# Patient Record
Sex: Male | Born: 1960
Health system: Western US, Academic
[De-identification: ages and names within clinical notes are randomized; demographics above are authoritative.]

---

## 2022-04-19 ENCOUNTER — Telehealth

## 2022-04-19 NOTE — Telephone Encounter
Call Back Request      Reason for call back: patient would  Like to schedule with you says he has extreme pain maybe fatty impigment.    Patient has a CD will try and upload but if not able can he schedule and bring in CD       Patient would like to know how much is out of pocket cost for office visit     Please call and advise   Thank You     Any Symptoms:  []  Yes  [x]  No       If yes, what symptoms are you experiencing:    o Duration of symptoms (how long):    o Have you taken medication for symptoms (OTC or Rx):      If call was taken outside of clinic hours:    [] Patient or caller has been notified that this message was sent outside of normal clinic hours.     [] Patient or caller has been warm transferred to the physician's answering service. If applicable, patient or caller informed to please call back if symptoms progress.  Patient or caller has been notified of the turnaround time of 1-2 business day(s).

## 2022-04-21 NOTE — Telephone Encounter
Patient was contacted but no answer. If patient calls back please inform them of the message below.

## 2022-05-16 NOTE — Telephone Encounter
Call Back Request      Reason for call back:   Patient would like to be seen for knee pain - patient was advised he has fatty impingement of right knee - patient was advised there may also be arthritis    Patient has MRI from 03/25/22    Patient has anthem that will be effective 05/22/22    Please assist, thank you     Any Symptoms:  []  Yes  [x]  No       If yes, what symptoms are you experiencing:    o Duration of symptoms (how long):    o Have you taken medication for symptoms (OTC or Rx):      If call was taken outside of clinic hours:    [] Patient or caller has been notified that this message was sent outside of normal clinic hours.     [] Patient or caller has been warm transferred to the physician's answering service. If applicable, patient or caller informed to please call us back if symptoms progress.  Patient or caller has been notified of the turnaround time of 1-2 business day(s).

## 2022-05-19 NOTE — Telephone Encounter
Called and left VM, patient should be scheduled with non op, no records on file

## 2022-05-22 DIAGNOSIS — I10 Essential (primary) hypertension: Secondary | ICD-10-CM

## 2022-05-22 DIAGNOSIS — F419 Anxiety disorder, unspecified: Secondary | ICD-10-CM

## 2022-05-23 ENCOUNTER — Telehealth: Payer: BLUE CROSS/BLUE SHIELD

## 2022-05-23 ENCOUNTER — Ambulatory Visit: Payer: BLUE CROSS/BLUE SHIELD

## 2022-05-23 DIAGNOSIS — Z7689 Persons encountering health services in other specified circumstances: Secondary | ICD-10-CM

## 2022-05-23 DIAGNOSIS — N529 Male erectile dysfunction, unspecified: Secondary | ICD-10-CM

## 2022-05-23 DIAGNOSIS — R202 Paresthesia of skin: Secondary | ICD-10-CM

## 2022-05-23 DIAGNOSIS — G8929 Other chronic pain: Secondary | ICD-10-CM

## 2022-05-23 DIAGNOSIS — H538 Other visual disturbances: Secondary | ICD-10-CM

## 2022-05-23 DIAGNOSIS — M25561 Pain in right knee: Secondary | ICD-10-CM

## 2022-05-23 NOTE — Patient Instructions
It was a pleasure seeing you today.    One or more specialist referrals were placed today. You can call [phone] for assistance in scheduling your appointment(s).   Dr. Wesley Blas in sports medicine    Please feel free to contact us should you have any additional questions or concerns.    Sincerely,    Cristie Hem, MD

## 2022-05-23 NOTE — Progress Notes
[facility] INTERNAL MEDICINE & PEDIATRICS  [facility]  [address]  [address]  [place] [address]  Phone: [phone]  FAX: [phone]    Subjective:     HPI:  Tyler Mahoney is a 61 y.o. male presenting for the following:    Chief Complaint   Patient presents with   ? Establish Care     Runs and surfs regularly, started experiencing R knee pain several months ago. Underwent MRI, had steroid injection and did PT. Now having trouble walking due to the knee. Has seen ortho as well. 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    1 month ago started experiencing tingling in the hands and feet, weight loss of about 4 lbs over several months, insomnia, anxiety, twitching in the eyes, and blurred vision, and heat sensitivity. Now unable to get or maintain erection. Experiencing significant illness anxiety surrounding these issues, particularly in light of his prior good health.    Saw psychiatry today after recent ED visit for severe anxiety. On ativan for now, planning to start daily medication and counseling.    Labs from 9/14 with glucose 116, AST 48, CK 256, hgb 13.5, a1c 5.7, RF 15 (ULN 14). Normal SPEP, UPEP, B12, folates, TSH, testosterone, ANA, CCP, inflammatory markers, hepatitis panel.    Has seen rheumatology, told no autoimmune disease.    Patient Active Problem List   Diagnosis   ? Adenomatous colon polyp   ? Anxiety   ? Erectile dysfunction   ? Essential hypertension   ? Hyperlipidemia       Medications and Allergies  Outpatient Medications Prior to Visit   Medication Sig   ? atorvastatin 40 mg tablet 1 tablet (40 mg total).   ? celecoxib (CELEBREX) 200 mg capsule 1 capsule (200 mg total).   ? eszopiclone (LUNESTA) 1 mg tablet 1 tablet (1 mg total).   ? LORazepam 0.5 mg tablet 1 tablet (0.5 mg total).   ? losartan 50 mg tablet 2 tablets (100 mg total). ? sildenafil 50 mg tablet 1 tablet (50 mg total).   ? traZODone 50 mg tablet 1 tablet (50 mg total).     No facility-administered medications prior to visit.       Not on File     Objective:     Physical Exam  BP 166/84  ~ Pulse 68  ~ Temp 36.8 ?C (98.3 ?F) (Tympanic)  ~ Resp 18  ~ Ht 5' 4'' (1.626 m)  ~ Wt 129 lb (58.5 kg)  ~ SpO2 97%  ~ BMI 22.14 kg/m?     General: alert, well appearing, no acute distress  Head: atraumatic, normocephalic  Eyes: sclerae anicteric, no conjunctival injection  Throat: clear oropharynx, no injection or exudate  Heart: normal rate, regular rhythm, normal S1, S2, no murmurs, rubs, or gallops  Lungs: clear to auscultation, no wheezes, rales, or rhonchi, normal work of breathing  Extremities: no edema  Skin: normal coloration, no rashes  Neuro: alert, oriented, CN II-XII intact, 5/5 strength throughout with exception of 4/5 with left knee flexion and extension, distal sensation grossly intact, no dysmetria, normal gait, negative Romberg   Psych: normal affect, insight, and judgment    Labs/Imaging  No results found for this or any previous visit.      Assessment:     1. Encounter to establish care    2. Anxiety  3. Paresthesia  4. Blurred vision  Unclear etiology of these neurologic symptoms, as he has undergone extensive workup involving laboratory evaluation that was reviewed at today's visit, reported brain MRI, and evaluation through outside rheumatologist that has been largely unremarkable.  Consider that they could be related to acute onset of recent severe anxiety.  - Referral to Neurology  - continue follow-up with Psychiatry    5. Erectile dysfunction, unspecified erectile dysfunction type  Reportedly developed after he stops taking several over-the-counter supplements.  Has upcoming appointment scheduled with Urology.    6. Chronic pain of right knee  Addressed in brief today, as he has had evaluation through multiple outside orthopedic specialists by his report. 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  Has completed full courses of PT along with steroid injection without improvement.  He is interested in additional opinion.  - Referral to Primary Care, Sports Medicine    7. Essential hypertension  Uncontrolled by today's measure, suspect this may be related at least in part to uncontrolled anxiety.  - defer home blood pressure checks for now given concern this may exacerbate anxiety  - follow-up in 2-4 weeks    The above plan of care, diagnosis, orders, and follow-up were discussed with the patient.  Questions related to this recommended plan of care were answered.    Cristie Hem, MD  Internal Medicine and Pediatrics Primary Care

## 2022-05-23 NOTE — Telephone Encounter
Message to Practice/Provider      Message: Pt was just seen by MD. Pt says that next available for neurology appointment was February. Pt says they could get him in sooner if referral was placed as urgent referral. Please advise    Return call is not being requested by the patient or caller.    Patient or caller has been notified of the turnaround time of 1-2 business day(s).

## 2022-05-24 NOTE — Addendum Note
Addended by: Krystal Eaton on: 05/24/2022 10:16 AM     Modules accepted: Orders

## 2022-05-25 ENCOUNTER — Ambulatory Visit: Payer: BLUE CROSS/BLUE SHIELD

## 2022-05-27 ENCOUNTER — Institutional Professional Consult (permissible substitution): Payer: BLUE CROSS/BLUE SHIELD

## 2022-05-27 ENCOUNTER — Ambulatory Visit: Payer: BLUE CROSS/BLUE SHIELD | Attending: Student in an Organized Health Care Education/Training Program

## 2022-05-27 ENCOUNTER — Ambulatory Visit: Payer: BLUE CROSS/BLUE SHIELD

## 2022-05-27 DIAGNOSIS — R200 Anesthesia of skin: Secondary | ICD-10-CM

## 2022-05-27 DIAGNOSIS — N529 Male erectile dysfunction, unspecified: Secondary | ICD-10-CM

## 2022-05-27 DIAGNOSIS — I10 Essential (primary) hypertension: Secondary | ICD-10-CM

## 2022-05-27 MED ORDER — GABAPENTIN 300 MG PO CAPS
300 mg | ORAL_CAPSULE | Freq: Every evening | ORAL | 0 refills | Status: SS | PRN
Start: 2022-05-27 — End: 2022-06-09

## 2022-05-27 NOTE — Progress Notes
OUTPATIENT PROGRESS NOTE      PATIENT: Tyler Mahoney  MRN: [id number]  DOB: Jun 04, 1961  DATE OF SERVICE: 05/27/2022  CHIEF COMPLAINT:   Chief Complaint   Patient presents with   ? Insomnia     Per patient has experienced a list of different symptoms over the last month: Overeating, sensitive to sun, high bp     HISTORY OF PRESENT ILLNESS   Tyler Mahoney is a 61 y.o. male w/ h/o below who is here for concerns about Insomnia (Per patient has experienced a list of different symptoms over the last month: Overeating, sensitive to sun, high bp).    Patient reports past 2-3 weeks his anxiety has significantly worsened.    State in the past he was always a happy individual but he had an injury of his knee and since has not been able to surf and may be why he has worsening anxiety.    State he is followed by Dr. Alvino Chapel, psychiatrist. Tyler Mahoney earlier this week and was switched from duloxetine to wellbutrin due to feeling muted on the duloxetine. Patient was also trialed on seroquel, trazodone, and ativan. State nothing seem to help with his anxiety and insomnia.    State feels like he is always on fight or flight feeling. And is anxious about his anxiety. Patient also report heat sensitivity w/ pruritus but w/o rash. As well as impotence, and numbness.    Patient reports worried he may have MS. However, state he had an MRI brain done in [facility] that he paid out of pocket that was normal and he also had a neurology friend look at it. State he has an appointment scheduled with an outside neurologist next month.    States he even self increased his blood pressure medication to see if it would help with his blood pressure but state it doesn't.    States he feels so off.    W/ passive SI, no plan    PAST MEDICAL AND SURGICAL HISTORY:   Past Medical History:   Diagnosis Date   ? Anxiety 05/04/2022   ? Depression 05/04/2022   ? Hyperlipidemia 03/20/2012    Thats an estimate   ? Hypertension 03/20/2012    Estimate   ? Memory problem 05/04/2032 Past Surgical History:   Procedure Laterality Date   ? VASECTOMY  08/26/96    Estimate       Family History   Problem Relation Age of Onset   ? Depression Mother    ? Hypertension Father    ? Depression Brother      SOC HISTORY:  Social History     Socioeconomic History   ? Marital status: Unknown   Tobacco Use   ? Smoking status: Never   ? Smokeless tobacco: Never   Substance and Sexual Activity   ? Alcohol use: Yes     Alcohol/week: 6.0 oz     Types: 4 Drinks Containing 1.5 oz of alcohol per week   ? Drug use: Yes     Frequency: 1.0 times per week     Types: Marijuana   ? Sexual activity: Yes     Partners: Female     Birth control/protection: Vasectomy   Other Topics Concern   ? Do you exercise at least a day, 3 or more days a week? Yes     Comment: But not in last 5 month   ? Types of Exercise? (List in Comments) Yes     Comment: Running  surfing   ? Do you follow a special diet? No   ? Vegan? No   ? Vegetarian? No   ? Pescatarian? No   ? Lactose Free? No   ? Gluten Free? No   ? Omnivore? No     MEDICATIONS:  Outpatient Medications Prior to Visit   Medication Sig   ? atorvastatin 40 mg tablet 1 tablet (40 mg total).   ? buPROPion, XL, 150 mg 24 hr tablet Take 1 tablet (150 mg total) by mouth.   ? celecoxib (CELEBREX) 200 mg capsule 1 capsule (200 mg total).   ? LORazepam 0.5 mg tablet 1 tablet (0.5 mg total).   ? losartan 50 mg tablet 2 tablets (100 mg total).   ? traZODone 50 mg tablet 1 tablet (50 mg total).   ? QUEtiapine 25 mg tablet    ? eszopiclone (LUNESTA) 1 mg tablet 1 tablet (1 mg total). (Patient not taking: Reported on 05/27/2022.)     No facility-administered medications prior to visit.     ALLERGIES:   No Known Allergies  IMMUNIZATIONS:  Immunization History   Administered Date(s) Administered   ? COVID-19, mRNA, (Moderna) 100 mcg/0.5 mL 10/23/2019, 11/20/2019, 08/03/2020   ? Influenza Vaccine, Pediatric, Trivalent, With Preservative 06/06/2013   ? Tdap 04/03/2013      REVIEW OF SYSTEMS:  14 system review performed; all systems negative except as documented above in HPI    PHYSICAL EXAM   BP (!) 185/84  ~ Pulse 68  ~ Temp 36.4 ?C (97.5 ?F) (Forehead)  ~ Ht 5' 4'' (1.626 m)  ~ Wt 130 lb 3.2 oz (59.1 kg)  ~ BMI 22.35 kg/m?   Wt Readings from Last 3 Encounters:   05/27/22 130 lb 3.2 oz (59.1 kg)   05/23/22 129 lb (58.5 kg)      BP Readings from Last 3 Encounters:   05/27/22 (!) 185/84   05/23/22 166/84      Body mass index is 22.35 kg/m?Marland Kitchen    General appearance: alert, appears stated age, NAD  Head:  normocephalic, atraumatic, without obvious abnormality  Eyes:  lids and conjunctiva grossly normal. Clear sclera  ENMT: external ears without abnormality  Neck: midline trachea  Cardiovascular;  RR.  Respiratory: no increase WOB  Skin: skin color grossly normal, no rashes or lesions on inspection or palpation  Musculoskeletal exam: inspection without abnormality, muscle tone grossly normal, extremities WWP, no LE edema  Neurological exam:  Strength and sensation grossly intact, normal gait  Psychiatric:  A&O, rapid speech, tangential, ruminating about his anxiety and symptoms    LABS/STUDIES   I have:   [x]  Reviewed/ordered []  1 [x]  2 []  ? 3 unique laboratory, radiology, and/or diagnostic tests noted below    [x]  Reviewed []  1 [x]  2 []  ? 3 prior external notes and incorporated into patient assessment    []  Discussed management or test interpretation with external provider(s) as noted       LABS:  No pertinent labs    Imaging Studies:   No results found.         Health Maintenance   Topic Date Due   ? Routine Hepatitis B Screening (recommended at least once for patients 18 and older)  Never done   ? Routine Hepatitis C Screening (recommended at least once for patients 54-48 years old)  Never done   ? Routine HIV Screening (recommended at least once for patients 92-31 years old)  Never done   ? Colorectal Cancer Screening  Never done   ? Shingles Vaccine (Shingrix)  (1 of 2) Never done   ? Annual Preventive Wellness Visit  Never done   ? COVID-19 Vaccine (4 - Moderna series) 09/28/2020   ? Flu Vaccine (1) 04/22/2022   ? Td (Tetanus) / Tdap (Tetanus, Pertussis-Whooping Cough) Vaccine (2 - Td or Tdap) 04/04/2023        A&P   Yovani Cogburn is a a 61 y.o. male presenting for:    With new onset anxiety and depression. F/b outside psychiatrist Dr. Alvino Chapel. Was on Duloxetine but felt muted with it so was switched to wellbutrin (since his brother is on it and doing well) this week. Patient sx consists of numbness, insomnia, heat sensitivity of his back, overeating, erectile dysfunction, pruritus. Patient report has had MRI brain unremarkable. Has Neurology appt scheduled in November for further work-up. Currently on seroquel, trazodone, and ativan w/o relief of insomnia. Does not have f/u appointment with his Psychiatrist. W/ passive SI though no plan. Found today with significantly elevated blood pressure. To which patient report has increased his losartan dose to 100mg  without improvement of his blood pressure. Query his sx are from his anxiety vs from organic causes. Pt currently not taking seroquel since it made Mahoney feel sick.  - BMP and UPC ordered to evaluate kidney function  - cont wellbutrin rx by psychiatrist.  - Trial gabapentin 300mg  at bedtime over the weekend for his anxiety and insomnia. Do not recommend taking gabapentin with the ativan, seroquel, and trazodone.  - If gabapentin doesn't, work, pt can try one of the other ones that Dr. Alvino Chapel prescribed. However, do not recommend taking all at the same time given risk for CNS depression.  - recommend pt follow-up with his psychiatrist Dr. Alvino Chapel  - recommend pt follow-up with his PCP Dr. Jordan Likes for blood pressure though query if bp would improve with mgmt of anxiety  - pt has appointment with Neurologist per pt.  - Provided ED/[facility]/return precaution.   - provided SI hotline    Patient Instructions   Please follow-up with Dr. Alvino Chapel your psychiatrist.  Please follow-up with Dr. Jordan Likes.  Suicide Prevention Hotlines:    ?         National Suicide Prevention Lifeline: 229-121-6514    ?         National Suicide Prevention Lifeline (Spanish): 931-361-1145    ?         For Deaf and Hard of Hearing contact Lifeline via TTY: 662 774 3537    ?         Suicide Prevention Hotline [place]: 224-304-9522    ?         Teen Line: (813)600-1050    ?         [employer] (Bilingual): 540-872-4720    ?         [employer]: 323 224 9110    ?         The [employer] Lifeline: (539)473-8528    Suicide Prevention Text lines:    ?         Crisis text line: 905-040-8563    ?         TrevorText: 660630      Anxiety Reaction  Anxiety is the feeling we all get when we think something bad might happen. It is a normal response to stress. It most often causes only a mild reaction. But it can interfere with daily life when anxiety is more severe. In some  cases, you may not know what you?re anxious about. Anxiety seems to have both mental and physical triggers. You may have stress from home and family. Or work and social relationships. Anxiety tends to run in families. This may mean it?s linked to genes.   During an anxiety reaction, you may feel:   ? Helpless  ? Nervous  ? Depressed  ? Grouchy  Your body may show signs of anxiety in many ways. You may have:   ? Dry mouth  ? Shakiness  ? Dizziness  ? Weakness  ? Trouble breathing  ? Fast breathing  ? Chest pressure  ? Sweating  ? Headache  ? Nausea  ? Diarrhea  ? Tiredness  ? Inability to sleep  ? Sexual problems  Home care  Try to find those things that set off anxiety in your life. They may not be obvious. They may include:   ? Daily hassles of life. This can include traffic jams, missed appointments, or car troubles.  ? Major life changes. This means both good changes, such as a new baby or job promotion. This can also mean tough life changes, such as loss of a job or loss of a loved one.  ? Overload. This means feeling that you have too many responsibilities. And that you can't take care of all of them.  ? Feeling helpless. You may feel you don?t have any control or choices. You may feel that your problems can't be solved.  Notice how your body reacts to stress. This will help you take action before the stress sets off anxiety. When you can, make changes to reduce the sources of your stress. But stress in life often can't be prevented. It is important to learn how to manage stress to reduce anxiety. There are many proven methods that will reduce your anxiety. These include:   ? Exercise  ? Good nutrition  ? Getting enough sleep  ? Relaxation methods  ? Breathing exercises  ? Visualization  ? Biofeedback  ? Meditation  ? Counseling  ? Medicine  For more information about this, talk with your healthcare provider. Or check online or at your [employer] or bookstore. You'll find many books and audiobooks on this subject.   Follow-up care  If you feel your anxiety is not getting better with self-help, call your healthcare provider. Or make an appointment with a counselor. You may need short-term counseling or medicine to help you manage anxiety.   Call 911  Call 911 if any of the following occur:   ? Trouble breathing  ? Confusion  ? Drowsiness or trouble waking up  ? Fainting  ? Rapid heart rate  ? Seizure  ? New chest pain that becomes more severe, lasts longer, or spreads into your shoulder, arm, neck, jaw, or back  Call or text [phone] if you have thoughts of harming yourself or others. You will be connected to trained crisis counselors at the [employer]. An online chat option is also available at http://hill.com/. You can also call Lifeline at 800-273-TALK (816)588-2526). Lifeline is free and available 24/7.   When to get medical advice  Call your healthcare provider right away if any of the following occur:   ? Symptoms that don't improve or get worse, such as feelings of hopelessness or overwhelming sadness  ? Severe headache not eased by rest and mild pain medicine  The National Suicide Prevention Lifeline is available at 800-273-TALK 651-833-3883). The Lifeline is available 24/7 and  provides free and confidential support. The Lifeline also has an Mudlogger at http://hill.com/.   StayWell last reviewed this educational content on 02/19/2021  ? 2000-2023 [employer], [employer]. All rights reserved. This information is not intended as a substitute for professional medical care. Always follow your healthcare professional's instructions.          Return if symptoms worsen or fail to improve.   Future Appointments   Date Time Provider Department Center   06/14/2022  4:30 PM Gillis Ends, MD PEDS [facility]   06/20/2022  8:00 AM Lacretia Nicks, DO NEUGENCAL [place]/       The above recommendation were discussed with the patient.  The patient has all questions answered satisfactorily and is in agreement with this recommended plan of care.    50 minutes were spent personally by me today on this encounter which include today's pre-visit review of the chart, obtaining appropriate history, performing an evaluation, documentation and discussion of management with details supported within the note for today's visit. The time documented was exclusive of any time spent on the separately billed procedure.    Armanda Magic, MD, MPH  Family Medicine  Health Science Clinical Instructor  Department of Medicine, [facility]  [address]  [place], [place] [address]  05/27/2022 5:35 PM

## 2022-05-27 NOTE — Patient Instructions
Please follow-up with Dr. Alvino Chapel your psychiatrist.  Please follow-up with Dr. Jordan Likes.  Suicide Prevention Hotlines:    ?         National Suicide Prevention Lifeline: 434-596-6084    ?         National Suicide Prevention Lifeline (Spanish): 430-760-8733    ?         For Deaf and Hard of Hearing contact Lifeline via TTY: 519-529-0587    ?         Suicide Prevention Hotline [place]: 438-562-4938    ?         Teen Line: 410-071-3106    ?         [employer] (Bilingual): (713) 134-6201    ?         [employer]: 615-180-1000    ?         The [employer] Lifeline: 401 551 2146    Suicide Prevention Text lines:    ?         Crisis text line: (832) 534-7802    ?         TrevorText: 706237      Anxiety Reaction  Anxiety is the feeling we all get when we think something bad might happen. It is a normal response to stress. It most often causes only a mild reaction. But it can interfere with daily life when anxiety is more severe. In some cases, you may not know what you?re anxious about. Anxiety seems to have both mental and physical triggers. You may have stress from home and family. Or work and social relationships. Anxiety tends to run in families. This may mean it?s linked to genes.   During an anxiety reaction, you may feel:   Helpless  Nervous  Depressed  Grouchy  Your body may show signs of anxiety in many ways. You may have:   Dry mouth  Shakiness  Dizziness  Weakness  Trouble breathing  Fast breathing  Chest pressure  Sweating  Headache  Nausea  Diarrhea  Tiredness  Inability to sleep  Sexual problems  Home care  Try to find those things that set off anxiety in your life. They may not be obvious. They may include:   Daily hassles of life. This can include traffic jams, missed appointments, or car troubles.  Major life changes. This means both good changes, such as a new baby or job promotion. This can also mean tough life changes, such as loss of a job or loss of a loved one.  Overload. This means feeling that you have too many responsibilities. And that you can't take care of all of them.  Feeling helpless. You may feel you don?t have any control or choices. You may feel that your problems can't be solved.  Notice how your body reacts to stress. This will help you take action before the stress sets off anxiety. When you can, make changes to reduce the sources of your stress. But stress in life often can't be prevented. It is important to learn how to manage stress to reduce anxiety. There are many proven methods that will reduce your anxiety. These include:   Exercise  Good nutrition  Getting enough sleep  Relaxation methods  Breathing exercises  Visualization  Biofeedback  Meditation  Counseling  Medicine  For more information about this, talk with your healthcare provider. Or check online or at your [employer] or bookstore. You'll find many books and audiobooks on this subject.  Follow-up care  If you feel your anxiety is not getting better with self-help, call your healthcare provider. Or make an appointment with a counselor. You may need short-term counseling or medicine to help you manage anxiety.   Call 911  Call 911 if any of the following occur:   Trouble breathing  Confusion  Drowsiness or trouble waking up  Fainting  Rapid heart rate  Seizure  New chest pain that becomes more severe, lasts longer, or spreads into your shoulder, arm, neck, jaw, or back  Call or text [phone] if you have thoughts of harming yourself or others. You will be connected to trained crisis counselors at the [employer]. An online chat option is also available at [email or web address] You can also call Lifeline at 800-273-TALK 819-039-7712). Lifeline is free and available 24/7.   When to get medical advice  Call your healthcare provider right away if any of the following occur:   Symptoms that don't improve or get worse, such as feelings of hopelessness or overwhelming sadness  Severe headache not eased by rest and mild pain medicine  The National Suicide Prevention Lifeline is available at 800-273-TALK 831-001-8258). The Lifeline is available 24/7 and provides free and confidential support. The Lifeline also has an Mudlogger at [email or web address]   StayWell last reviewed this educational content on 02/19/2021  ? 2000-2023 [employer], [place]. All rights reserved. This information is not intended as a substitute for professional medical care. Always follow your healthcare professional's instructions.

## 2022-05-28 LAB — Basic Metabolic Panel: CALCIUM: 9.6 mg/dL (ref 8.6–10.4)

## 2022-05-28 LAB — PROTEIN/CREATININE RATIO, URINE: PROTEIN,URINE: 8 mg/dL (ref 0.0–0.4)

## 2022-06-04 ENCOUNTER — Ambulatory Visit: Payer: BLUE CROSS/BLUE SHIELD

## 2022-06-04 NOTE — ED Notes
[facility]?NOTIFICATION?06/04/2022 13:41?[place], [place]?MRN: [id number]    Criteria Met      CURES    Security and Safety  No Security Events were found.  ED Care Guidelines  There are currently no ED Care Guidelines for this patient. Please check your facility's medical records system.          Prescription Drug Report (12 Mo.)  Rx Details  Fill Date Drug Description Qty. Prescriber   2022-05-30 CLONAZEPAM/0.5 MG/TAB [address] [place]   2022-05-20 LORAZEPAM/0.5 MG/TAB 3 [facility]   2022-05-20 ESZOPICLONE/1 MG/TAB 4 PASSARO, Lollie Marrow, RYAN     Rx Summary  Metric Count   Quantity Dispensed 21   Unique Prescribers 2   Unique Pharmacies 1       E.D. Visit Count (12 mo.)  Facility Visits   [facility] 1   Total 1   Note: Visits indicate total known visits.     Recent Emergency Department Visit Summary  Date [place] Type Diagnoses or Chief Complaint    Jun 04, 2022  [place]  Emergency       Recent Inpatient Visit Summary  No Recent Inpatient Visits were found.  Care Team  No Care Team was found.  [facility]  This patient has registered at the [facility] Emergency Department   For more information visit: [email or web address]   PLEASE NOTE:     1.   Any care recommendations and other clinical information are provided as guidelines or for historical purposes only, and providers should exercise their own clinical judgment when providing care.    2.   You may only use this information for purposes of treatment, payment or health care operations activities, and subject to the limitations of applicable [facility] Policies.    3.   You should consult directly with the organization that provided a care guideline or other clinical history with any questions about additional information or accuracy or completeness of information provided.    ? 5176 [facility] - www.[email or web address]

## 2022-06-05 ENCOUNTER — Inpatient Hospital Stay
Admit: 2022-06-05 | Discharge: 2022-06-05 | Disposition: A | Discharge status: Other Acute Inpatient Hospital | Payer: BLUE CROSS/BLUE SHIELD | Source: Home / Self Care

## 2022-06-05 DIAGNOSIS — R45851 Suicidal ideations: Secondary | ICD-10-CM

## 2022-06-05 DIAGNOSIS — R488 Other symbolic dysfunctions: Secondary | ICD-10-CM

## 2022-06-05 LAB — Bilirubin,Total: BILIRUBIN,TOTAL: 0.2 mg/dL (ref 0.1–1.2)

## 2022-06-05 LAB — HS Troponin I (Reflexed): HIGH SENSITIVITY TROPONIN I: 7 ng/L — ABNORMAL HIGH (ref <5)

## 2022-06-05 LAB — Electrolyte Panel: CHLORIDE: 107 mmol/L — ABNORMAL HIGH (ref 96–106)

## 2022-06-05 LAB — Calcium: CALCIUM: 8.9 mg/dL (ref 8.6–10.4)

## 2022-06-05 LAB — Magnesium: MAGNESIUM: 1.7 meq/L (ref 1.4–1.9)

## 2022-06-05 LAB — Expedited COVID-19 and Influenza A B PCR: COVID-19 PCR/TMA: NOT DETECTED

## 2022-06-05 LAB — COVID-19 PCR: COVID-19 PCR/TMA: NOT DETECTED

## 2022-06-05 LAB — Alkaline Phosphatase: ALKALINE PHOSPHATASE: 68 U/L (ref 37–113)

## 2022-06-05 LAB — CREATININE: ESTIMATED GFR 2021 CKD-EPI: 78 mL/min/{1.73_m2} (ref 0.60–1.30)

## 2022-06-05 LAB — TSH with reflex FT4, FT3: TSH: 1.2 u[IU]/mL (ref 0.3–4.7)

## 2022-06-05 LAB — UA,Microscopic: SQUAMOUS EPITHELIAL CELLS: 0 {cells}/uL (ref 0–17)

## 2022-06-05 LAB — UA,Dipstick: SPECIFIC GRAVITY: 1.018 (ref 1.005–1.030)

## 2022-06-05 LAB — CBC: RED CELL DISTRIBUTION WIDTH-SD: 42.9 fL (ref 36.9–48.3)

## 2022-06-05 LAB — Aspartate Aminotransferase: ASPARTATE AMINOTRANSFERASE: 49 U/L (ref 13–62)

## 2022-06-05 LAB — HS Troponin I + Reflex If >=  5 ng/L: HIGH SENSITIVITY TROPONIN I: 6 ng/L — ABNORMAL HIGH (ref <5)

## 2022-06-05 LAB — Urea Nitrogen: UREA NITROGEN: 17 mg/dL (ref 7–22)

## 2022-06-05 LAB — Phosphorus: PHOSPHORUS: 3.9 mg/dL (ref 2.3–4.4)

## 2022-06-05 LAB — Glucose, Whole Blood: GLUCOSE, WHOLE BLOOD: 144 mg/dL — ABNORMAL HIGH (ref 65–99)

## 2022-06-05 LAB — Alanine Aminotransferase: ALANINE AMINOTRANSFERASE: 59 U/L (ref 8–70)

## 2022-06-05 MED ADMIN — OLANZAPINE 2.5 MG PO TBDP: 2.5 mg | ORAL | @ 09:00:00 | Stop: 2022-06-05 | NDC 33342008307

## 2022-06-05 MED ADMIN — CLONAZEPAM 0.25 MG PO TBDP: .5 mg | ORAL | @ 07:00:00 | Stop: 2022-06-05 | NDC 49884030752

## 2022-06-05 NOTE — Consults
Psychiatric ED Consultation Note 06/04/2022   Patient Name: Tyler Mahoney   Patient MRN: [id number]   Date of Birth: June 12, 1961   Patient Location: RR03C/RR03C     Patient Assessment:  This visit was conducted in-person with the patient.    Requesting Physician:  Darrick Penna., MD    Identifying Data:  Tyler Mahoney is a 61 y.o. male with no formal psychiatric history (possible undiagnosed depression in his teens in the setting of mother passing from lung cancer, no hospitalizations, no suicide attempts) who presents due to worsening ruminative anxiety, multiple somatic symptoms, chronic insomnia leading to passive SI and hopelessness; consult for DTS.     Chief Complaint: ''I am convinced I have MS''    Collateral Contact Information:  No emergency contact information on file.  Outpatient Psychiatrist: Birdie Riddle, MD  Outpatient Therapist: Has seen one but not consistently; trying to find a good fit    History of Present Illness:  Tyler Mahoney is a 61 y.o. male with no formal psychiatric history (possible undiagnosed depression in his teens in the setting of mother passing from lung cancer, no hospitalizations, no suicide attempts) who presents due to worsening ruminative anxiety, multiple somatic symptoms, chronic insomnia leading to passive SI and hopelessness; consult for DTS.    Interview conducted with patient and brother at bedside. Patient describes that he injured his knee a few months ago and since has had a bunch of medical ailments.  Patient describes he is noticed he is more sensitive to the sun, has had worsening blood pressure, has experienced weight loss despite overeating, has had more frequent urination, has had a unilateral cold foot, has had eye twitching, has had constipation, has had some muscular weakness and atrophy -- all leading to thinking that he has MS.  He describes that he is undergone multiple extensive workups including an outpatient MRI & a spinal tap while being admitted to an outside hospital.  Results have been reassuring that he does not have MS or another neurological condition.  He describes that he started seeing a psychiatrist, Dr. Birdie Riddle, who has prescribed multiple medications.  Per chart he has trialed Cymbalta, Wellbutrin, Seroquel, trazodone, Ativan, Lunesta, gabapentin, Klonopin.  He reports that in the last few weeks he is not been able to sleep more than 2-3 hours of sleep at night.  Despite not getting sleep he remains active during the day.  Brother at bedside describes that he has had to come down to [place] from [place] twice because of ruminative anxiety.  Brother describes that patient paces swears that himself is ruminative that he has done something to himself.  Patient states that he believes he is either developing MS or that erectile dysfunction medication he got from a gas station and took over the last decade have been causing these neurological symptoms.  Overall patient seems to endorse extreme feelings of guilt and hopelessness, increased appetite, insomnia, psychomotor agitation, and passive suicidal ideation.  He reports ?I can not go on like this.''When asked about a plan, he states ''I do not have 1 right now.  I do not have a gun.  I suppose I could jump a high surface or I suppose I could maybe overdose on medications I have.? Reports he is scared to go home. Patient denies AH/VH/HI.  Patient denies substance use.    Psychiatric History:  - Psychiatric diagnoses: No formal psychiatric history  - Psychiatric hospitalizations: No prior hospitalizations.  - Suicide attempts: No history of  suicide attempts.  - Self-injurious behavior: No history of self-injurious behavior.  - Violent behavior: No history of violent behavior.  - Engagement in outpatient psychiatric care: Seeing Birdie Riddle, MD   - Psychiatric medication trials: In the past few weeks, has trialed cymbalta, wellbutin for mood as well as lunesta, ativan, klonopin, seroquel, trazodone, and gabapentin for sleep    Substance Abuse History:  Denies    Alcohol Screen:  No or Low Risk: The patient was screened with a validated tool, and the score on the alcohol screen indicates no or low risk of alcohol related problems.    Tobacco Screen:  Never tobacco user.    Social History:  Lives with wife. Has two children in their late 16s, early 30s. 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 Likes to [employer] and run, generally is very physically active.     Family Psychiatric History:  Mother with possible Bipolar Disorder, EtOH use disorder (multiple hospitalizations in patient's youth leading to father getting custody)   Brother OCD, EtOH use disorder     Developmental and Educational History:  Not assessed due to patient's adult age and current clinical presentation.    Assets and Strengths:   Support of family/friends/partner (family)  Recreational interests (physical activity)  Good physical health (.)  Motivation for treatment (help-seeking)    Disabilities and Liabilities:  Poor understanding of illness (limited insight)    Past Medical History:  HTN   HLD    Allergies and Adverse Drug Reactions:  No Known Allergies    Medication Reconciliation:  WBN XL 150 mg   Klonopin 0.5 mg prn   Atorvastatin 40 mg   Losartan 100 mg     Review of Systems (of note, patient reports/endorses multiple somatic symptoms as detailed above and in ED note)  Constitutional: No fevers, chills, night sweats, + weight loss, malaise, or fatigue.   HEENT: No visual or auditory changes. No headache. No upper respiratory symptoms including cough, sore throat, runny nose, or sinus congestion.   Neck: No stiff neck, neck swelling.   Cardiovascular: No chest pain, palpitations, or shortness of breath.   Pulmonary: No orthopnea or paroxysmal nocturnal dyspnea.   Gastrointestinal: No nausea, vomiting, diarrhea, + constipation. No hematemesis, hematochezia, or melena.   Genitourinary: No dysuria, hematuria, + frequency, or urgency. Integumentary: No edema. No rash.   Neurologic: No focal weakness, numbness, paresthesia, or gait abnormality.    Physical Examination:  Vitals:    06/04/22 1556 06/04/22 1818 06/04/22 1936 06/04/22 1939   BP: 172/111 171/89 176/95 162/91   Pulse: 69 71 59 60   Resp: 18  18    Temp: 37.2 ?C (99 ?F)  36.7 ?C (98.1 ?F)    TempSrc: Temporal Temporal Temporal    SpO2: 100%  99%    Weight:       Height:            General: Appears to be in no acute distress.  HEENT: NC/AT, MMM, oropharynx clear.  CV: RRR, normal S1/S2, no M/R/G.  Pulm: CTAB, no wheezes/rhonchi/rales.  Abd: Soft, NT/ND, normal bowel sounds, no rebound or guarding.  Ext: No C/C/E, 2+ posterior tibial artery pulses bilaterally.  Neuro: 5/5 motor strength and sensation to light touch intact throughout bilateral upper and lower extremities, normal gait.     Cranial Nerve Examination:  CN II, III, IV, VI: PERRL, EOMI, no nystagmus, no ptosis on penlight exam.  CN V: Facial sensation intact to light touch, muscles of  mastication intact on direct observation.  CN VII: Face symmetric with good forehead wrinkle and smile excursion bilaterally, no facial droop on direct observation.  CN VIII: Hearing to finger rub is intact bilaterally.  CN IX, X: Uvula is midline, palate raises symmetrically, no hypophonia on pharyngeal exam.   CN XI: Shoulder shrug intact and equal bilaterally on shoulder shrug test.   CN XII: Tongue midline without fasciculation on tongue protrusion test.     Laboratory Data and Studies:  Recent Results (from the past 72 hour(s))   ED INFORMATION EXCHANGE    Collection Time: 06/04/22  1:42 PM   Result Value Ref Range    Emer. Dept. Info Exchange - Care Plan      Emer. Dept. Engineer, mining. Dept. Info Exchange - 30 day Visit Count 1     Emer. Dept. Info Exchange - 180 day Visit Count 1    Phosphorus    Collection Time: 06/04/22  6:48 PM   Result Value Ref Range    Phosphorus 3.9 2.3 - 4.4 mg/dL   Calcium    Collection Time: 06/04/22  6:48 PM   Result Value Ref Range    Calcium 8.9 8.6 - 10.4 mg/dL   Magnesium    Collection Time: 06/04/22  6:48 PM   Result Value Ref Range    Magnesium 1.7 1.4 - 1.9 mEq/L   CBC without differential    Collection Time: 06/04/22  6:48 PM   Result Value Ref Range    White Blood Cell Count 6.22 4.16 - 9.95 x10E3/uL    Red Blood Cell Count 4.54 4.41 - 5.95 x10E6/uL    Hemoglobin 13.9 13.5 - 17.1 g/dL    Hematocrit 45.4 09.8 - 52.0 %    Mean Corpuscular Volume 90.5 79.3 - 98.6 fL    Mean Corpuscular Hemoglobin 30.6 26.4 - 33.4 pg    MCH Concentration 33.8 31.5 - 35.5 g/dL    Red Cell Distribution Width-SD 42.9 36.9 - 48.3 fL    Red Cell Distribution Width-CV 13.0 11.1 - 15.5 %    Platelet Count, Auto 264 143 - 398 x10E3/uL    Mean Platelet Volume 9.4 9.3 - 13.0 fL    Nucleated RBC%, automated 0.0 No Ref. Range %    Absolute Nucleated RBC Count 0.00 0.00 - 0.00 x10E3/uL   Electrolyte Panel (Na, K, Cl, CO2)    Collection Time: 06/04/22  6:48 PM   Result Value Ref Range    Sodium 137 135 - 146 mmol/L    Potassium 3.8 3.6 - 5.3 mmol/L    Chloride 107 (H) 96 - 106 mmol/L    Total CO2 19 (L) 20 - 30 mmol/L    Anion Gap 11 8 - 19 mmol/L   Urea Nitrogen    Collection Time: 06/04/22  6:48 PM   Result Value Ref Range    Urea Nitrogen 17 7 - 22 mg/dL   Creatinine    Collection Time: 06/04/22  6:48 PM   Result Value Ref Range    Creatinine 1.08 0.60 - 1.30 mg/dL    Estimated GFR 78 See GFR Additional Information mL/min/1.83m2    GFR Additional Information See Comment    Glucose    Collection Time: 06/04/22  6:48 PM   Result Value Ref Range    Glucose 144 (H) 65 - 99 mg/dL   AST (SGOT)    Collection Time: 06/04/22  6:48 PM   Result Value  Ref Range    Aspartate Aminotransferase 49 13 - 62 U/L   ALT (SGPT)    Collection Time: 06/04/22  6:48 PM   Result Value Ref Range    Alanine Aminotransferase 59 8 - 70 U/L   Bilirubin,Total    Collection Time: 06/04/22  6:48 PM   Result Value Ref Range    Bilirubin,Total 0.2 0.1 - 1.2 mg/dL   Alkaline Phosphatase    Collection Time: 06/04/22  6:48 PM   Result Value Ref Range    Alkaline Phosphatase 68 37 - 113 U/L   HS Troponin + 2h reflex IF >= 5 ng/L    Collection Time: 06/04/22  6:48 PM   Result Value Ref Range    High Sensitivity Troponin I 6 (H) <5 ng/L   TSH with reflex FT4, FT3    Collection Time: 06/04/22  6:48 PM   Result Value Ref Range    TSH 1.2 0.3 - 4.7 mcIU/mL   ECG, 12 lead    Collection Time: 06/04/22  7:40 PM   Result Value Ref Range    Ventricular Rate 53 BPM    Atrial Rate 53 BPM    P-R Interval 173 ms    QRS Duration 105 ms    Q-T Interval 454 ms    QTC Calculation (Bezet) 427 ms    P Axis 47 degrees    R Axis 76 degrees    T Axis 55 degrees    Diagnosis Sinus rhythm     Diagnosis 12 Lead; Mason-Likar     Diagnosis Normal ECG    UA,Dipstick    Collection Time: 06/04/22  8:17 PM    Specimen: Clean Catch, Midstream; Urine   Result Value Ref Range    Urine Color Light-Yellow      Specific Gravity 1.018 1.005 - 1.030    pH,Urine 6.0 5.0 - 8.0    Blood Negative Negative    Bilirubin Negative Negative    Ketones Negative Negative    Glucose Negative Negative    Protein Negative Negative    Leukocyte Esterase Negative Negative    Nitrite Negative Negative   UA,Microscopic    Collection Time: 06/04/22  8:17 PM    Specimen: Clean Catch, Midstream; Urine   Result Value Ref Range    RBC per uL 2 0 - 11 cells/uL    WBC per uL 5 0 - 22 cells/uL    RBC per HPF 0 0 - 2 cells/HPF    WBC per HPF 1 0 - 4 cells/HPF    Squamous Epi Cells 0 0 - 17 cells/uL       Mental Status Examination:   Appearance: 61 y.o. male, appears stated age, good grooming and hygiene, dressed appropriately, resting comfortably in bed  Behavior: anxious, cooperative, engaged, good eye contact  Motor: No psychomotor agitation or retardation, no tremor, no evidence of extrapyramidal signs, gait was not assessed  Speech: Clear, coherent, fluent, normal rate, tone, volume, and prosody, normal comprehension  Mood: ''I can't go on like this''  Affect: Affect was anxious  Thought process: Linear, logical, goal-directed, coherent  Thought content: +passive suicidal ideation with no intent/plan, no homicidal ideation, no auditory or visual hallucinations, no paranoia, no delusions  Cognition: Oriented to person, place, time, and situation, intellectual functioning and fund of knowledge appropriate to age and education, short and long-term memory grossly intact   Insight: Limited insight as evidenced by diminished ability to verbalize an understanding of the nature, cause, and significance  of their illness, its effect on their current circumstances, and the potential benefits of treatment   Judgment: Fair judgment as evidenced by help-seeking behavior.       Suicide Risk Assessment:  Risk and protective factors:  1. Suicidal ideation:     Passive SI, a wish to be dead or not alive   2. Intent to act upon thoughts of suicide:     Ambivalent about intent   3. Firearms:     No access to a firearm   4. Access to other stated means and environmental factors:     Has means to overdose   5. Recent suicidal behaviors or preparatory acts:     None   6. Prior suicide attempts:     None   7. Self-directed violence without suicidal intent:     None   8. Psychiatric and family history:    See psychiatric history for details on diagnoses and hospitalizations   9. Other key symptoms and dynamic risk factors:     Severe symptoms of: Insomnia, Anxiety or panic    10. Protective factors:    (Absences of risk factors are also considered proctective factors)  Support from family, friends, or others, specifically family  Engagement in mental health care  Help seeking behaviors     Risk formulation and interventions:  11. Baseline chronic risk   Compared to the general population, the patient's baseline chronic suicide risk is estimated to be:    Mildly elevated baseline risk   12. Acute risk  The patient's current, acute risk is estimated to be:    Mildly elevated above his baseline   13. Risk mitigation and interventions:    Plan to pursue a higher level of care    14. Additional considerations:    None   15. The most appropriate and least restrictive treatment recommendations at this time are:    Voluntary psychiatric hospitalization     Diagnostic Impression:  Mental Health Diagnoses and Relevant Medical Conditions:  Unspecified Mood and Anxiety   - C/F Psychotic Depression vs. Somatoform Disorder (somatic symptom vs. Illness anxiety)    Significant Psychosocial and Contextual Factors:  Lives with wife, supportive family   Engaged in work   Help-seeking     Assessment and Plan:  Tyler Mahoney is a 61 y.o. male with no formal psychiatric history (possible undiagnosed depression in his teens in the setting of mother passing from lung cancer, no hospitalizations, no suicide attempts) who presents due to worsening ruminative anxiety, multiple somatic symptoms, chronic insomnia leading to passive SI and hopelessness; consult for DTS.  On interview, patient constricted and anxious, perseverative that he himself has provoked a debilitating medical illness based on numerous observed somatic symptoms. Patient describes 2+ weeks of ruminative anxiety and insomnia, endorsing extreme feelings of guilt and hopelessness, increased appetite, psychomotor agitation, and passive suicidal ideation.  Patient with no formal psychiatric history prior to this episode. Differential diagnosis is broad but most concerning for a psychotic depression in the setting of injury whilst grappling with existential life transition to retirement. Symptoms could also reflect a somatoform disorder. At this time patient has failed outpatient treatment; over the past few weeks, patient has trialed multiple medications under the guidance of outpatient psychiatrist with no efficacy and in fact possible worsening of symptoms. Patient meets criteria for inpatient hospitalization for stabilization, medication management, and referral to appropriate resources. Patient would like to be voluntarily admitted to [facility].  Will continue home medication of Wellbutrin,  Klonopin 0.5 mg p.r.n. and add Zyprexa 2.5 mg as a second-line p.r.n. for anxiety, agitation.  Defer further medication management to inpatient team.     1. Psychiatric:  - Plan to admit for inpatient psychiatric hospitalization  - Continue home Wellbutrin XL 150 mg    - Klonopin 0.5 mg b.i.d. P.r.n -- 1st line for anxiety, insomnia, agitation  - Zyprexa 2.5 mg q.i.d. p.r.n. --2nd line for anxiety, insomnia, agitation  - Zyprexa 5 mg IM p.r.n. for emergent agitation    Goals for Hospitalization:  Tyler Mahoney will demonstrate improvement in psychotic depression as evidenced by ability to maintain 2 minute linear conversation with treatment team by his anticipated discharge date 1 week from now (on 06/11/2022). (06/04/2022, 10:18 PM: added as goal by Valetta Fuller. Dacarett-Galeano)      2. Medical:  #HTN  #HLD   - Continue home Losartan 100 mg   - Continue home Atorvastatin 40 mg     #FEN/GI/PPx:  -Nutrition: low sodium diet.    3. Legal Status: Voluntary  -Voluntary    4. Code Status: Full code.    5. Disposition: Admit to [facility].    Note written by Valetta Fuller. Dacarett-Galeano, psychiatry resident. This patient was discussed with Dr. Hazeline Junker, attending psychiatrist, with whom the above assessment and plan were jointly formulated. Recommendations and plan were discussed with referring treatment team.

## 2022-06-05 NOTE — ED Provider Notes
[name]  Emergency Department Service Report    Tyler Mahoney is a 61 y.o. male presenting with Anxiety (Pt reports increasing anxiety and insomnia x 1 month. Pt reports can't function. Brother reports pt starting to have delusions. Pt reports this is stemming from him believing that he has MS, pt reports has gone through testing, and MD can't find anything. Reports multiple ailments including weight loss, knees pain, constipation, hair and nails not growing. Denies AVH. Pt denies SI but is ''starting to feel hopeless''.) and Insomnia      Triage   Arrived at 1:41 PM  Arrived by Walk-in [[id number]]    ED Triage Vitals   Temp Temp Source BP Heart Rate Resp SpO2 O2 Device Pain Score Weight   06/04/22 1352 06/04/22 1352 06/04/22 1352 06/04/22 1352 06/04/22 1352 06/04/22 1352 06/04/22 1346 06/04/22 1346 06/04/22 1346   36.5 ?C (97.7 ?F) Temporal 141/88 65 16 98 % None (Room air) Six 59 kg (130 lb)     Chief Complaint   Patient presents with   ? Anxiety     Pt reports increasing anxiety and insomnia x 1 month. Pt reports can't function. Brother reports pt starting to have delusions. Pt reports this is stemming from him believing that he has MS, pt reports has gone through testing, and MD can't find anything. Reports multiple ailments including weight loss, knees pain, constipation, hair and nails not growing. Denies AVH. Pt denies SI but is ''starting to feel hopeless''.   ? Insomnia        Comprehensive Exam Initiated  Contact Date: 06/04/22  Contact Time: 1807    History of Present Illness     Tyler Mahoney is a 61 y.o. male with past medical history of HTN, HLD, anxiety and depression presenting with 1 month of worsening anxiety and insomnia.  Patient reports that over the past month he is become increasingly anxious and has been unable to sleep.  Over the past week he is gotten about 10 hours of sleep total.  His family member reports that at night he just paces the house talking to himself.  Patient also reports that he is lost significant weight over the past month, including 5 lb over the past week.  This is despite being hungry all the time.  He also endorses heat intolerance.  Denies fevers, chills, chest pain, shortness of breath, abdominal pain, diarrhea.  Reports decreased skin turgor and BLLE paraesthesias.  Endorses constipation.  Patient's family member reports that the patient is convinced that he has MS and has been obsessing over this.  Recent outpatient and my reportedly unremarkable.  Patient endorses passive suicidal ideation secondary to his somatic symptoms.      Pertinent family history: None  Pertinent social history: As above  Records reviewed: Triage note and vitals    Review of Systems      A 10 point review of systems was performed and was negative except as documented in the above HPI.    Physical  Exam     Vital signs reviewed  General:  Well-appearing in no acute distress. Thin.  HEENT:  Normocephalic, atraumatic.  Eyes:  Extraocular eye movements grossly intact, no scleral icterus.  Cardiovascular:  Strong radial pulse, regular rhythm.   Respiratory:  Normal and nonlabored breathing.   Abdomen:  Nondistended.   Musculoskeletal:  No deformities or edema.  Neurological:  Alert and oriented to person, place, time, and event.  Moving all 4 extremities with no  apparent focal neurologic deficits.  Normal gait.  Psychiatric:  Somewhat anxious, interacting appropriately with staff.  Skin:  No apparent rashes or erythema.    Medical Decision Making     Tyler Mahoney is a 61 y.o. male with past medical history of HTN, HLD, anxiety and depression presenting with 1 month of worsening anxiety, insomnia, weight loss, heat intolerance. On arrival, patient is AF, VSS and in no acute distress.  Exam as above.     Additional MDM:  Review of external records: Prev outpatient records  Discussion with independent historian: Family member   Chronic conditions affecting care: As above  Discussion with other healthcare professional: ***  Social determinants of health: ***    Progress Notes      Additional past medical history:    Past Medical History:   Diagnosis Date   ? Anxiety 05/04/2022   ? Depression 05/04/2022   ? Hyperlipidemia 03/20/2012    Thats an estimate   ? Hypertension 03/20/2012    Estimate   ? Memory problem 05/04/2032        Past Surgical History:   Procedure Laterality Date   ? VASECTOMY  08/26/96    Estimate        Laboratory Results     Labs Reviewed   ELECTROLYTE PANEL - Abnormal; Notable for the following components:       Result Value    Chloride 107 (*)     Total CO2 19 (*)     All other components within normal limits   GLUCOSE - Abnormal; Notable for the following components:    Glucose 144 (*)     All other components within normal limits   CBC   CREATININE,WHOLE BLOOD   PHOSPHORUS   CALCIUM   MAGNESIUM   UREA NITROGEN   AST (SGOT)   ALT (SGPT)   BILIRUBIN,TOTAL   ALKALINE PHOSPHATASE   HS TROPONIN I + REFLEX IF >= 5 NG/L   URINALYSIS W/REFLEX TO CULTURE    Narrative:     The following orders were created for panel order Urinalysis w/Reflex to Culture.  Procedure                               Abnormality         Status                     ---------                               -----------         ------                     UA,Dipstick[[id number]]                                                                 UA,Microscopic[[id number]]  Please view results for these tests on the individual orders.   TSH WITH REFLEX FT4, FT3   UA,DIPSTICK   UA,MICROSCOPIC      Imaging Results     XR chest ap portable (1 view)   Preliminary Result by Roxan Hockey (10/14 1905)   IMPRESSION:       The cardiomediastinal silhouette is unremarkable.   No focal consolidation.   No pleural effusion. No pneumothorax.   No acute osseous abnormality. Mild bony maturational change.      THIS IS A PRELIMINARY REPORT THAT HAS NOT BEEN REVIEWED BY AN ATTENDING RADIOLOGIST.      Dictated by: Danella Penton   06/04/2022 7:05 PM        Any ED procedures performed are documented on separate ED procedure notes.    Clinical Impression   No diagnosis found.  Imaging Results   Disposition:  ***    Return precautions are specified on After Visit Summary.    New Prescriptions    No medications on file     Orders Placed This Encounter   ? XR chest ap portable (1 view)   ? Phosphorus   ? Calcium   ? Magnesium   ? CBC without differential   ? Electrolyte Panel (Na, K, Cl, CO2)   ? Urea Nitrogen   ? Creatinine   ? Glucose   ? AST (SGOT)   ? ALT (SGPT)   ? Bilirubin,Total   ? Alkaline Phosphatase   ? HS Troponin + 2h reflex IF >= 5 ng/L   ? Urinalysis w/Reflex to Culture   ? TSH with reflex FT4, FT3   ? UA,Dipstick   ? UA,Microscopic   ? ECG, 12 lead   ? clonazePAM 0.5 mg tablet       Resident Signature     Ferrel Logan, MD / [facility]  PGY2 Emergency Medicine  06/04/2022 7:20 PM

## 2022-06-05 NOTE — Other
[facility] [place]  PATIENT/GUARDIAN/PARENT CONSENT TO TREATMENT WITH SPECIFIED MEDICATIONS    Author met with patient, conservator, or parent to discuss the patient's condition which requires treatment. Author has recommended treatment of this illness or condition with psychotropic medications. The patient has been provided with the following information by author prescribing such medications:  1. The nature of the mental illness/condition  2. The reasons for taking such medications, including the likelihood of improving or not improving without such medication, and that patient's consent, once given, may be withdrawn at any time by patient stating such intention to any member of the treating staff.  3. The reasonable alternative treatment available, if any.  4. The type, frequency, amount, method (oral or injection), and duration of medication treatment.  5. The side effects that this/these particular medication(s) may cause, as well as side effects which may occur because of physical or medical condition(s) that the patient may have or interaction with other medications or foods. If neuroleptics have been prescribed, Thereasa Parkin has discussed with patient the possible complication of tardive dyskinesia, its symptoms and implications.  6. The possible consequences of abrupt discontinuation of this medication and ways to avoid these consequences.  7. The possible additional side effects which may occur when taking such medication beyond three months.  8. The patient has the right to request and be provided whatever additional information they desire.    The patient/parent/guardian provided verbal consent that: (1) they agreed to the foregoing; (2) the medications and treatment set forth above have been adequately explained and/or discussed with with patient/parent/guardian by Thereasa Parkin, and they have received all of the information they desire concerning such medication and treatment; and (3) they authorize and consent to the administration of such medications and treatment.    Patient's/Guardian's/Parents Name: (Print First and Last Name)  Tyler Mahoney Patient's/Guardian/Parent's Signature:  Verbal consent provided Date:  06/04/2022 Time:  10:20 PM   Physician's Name: (Print First and Last Name)  Valetta Fuller. Dacarett-Galeano Physician's Signature:  Nurse, mental health by Valetta Fuller. Dacarett-Galeano Date:  06/04/2022 Time:  10:20 PM   *Witness' Name: (Print First and Last Name) Witness' Signature: Date: Time:   * RN signature required for telephonic consent only.

## 2022-06-14 ENCOUNTER — Ambulatory Visit: Payer: BLUE CROSS/BLUE SHIELD

## 2022-06-14 DIAGNOSIS — G8929 Other chronic pain: Secondary | ICD-10-CM

## 2022-06-14 DIAGNOSIS — M25561 Pain in right knee: Secondary | ICD-10-CM

## 2022-06-14 NOTE — Progress Notes
[facility]  [facility]  [address] [place]  [address]  [place] [address]  Phone: [phone]  FAX: [phone]    Sports Medicine Consult Note    Date of Service: 06/14/2022  Requesting Physician: Tyler Deeds, MD  PCP: Tyler Mahoney., MD    Subjective:     Chief Complaint: Leg Pain    Patient Active Problem List   Diagnosis   ? Adenomatous colon polyp   ? Anxiety   ? Erectile dysfunction   ? Essential hypertension   ? Hyperlipidemia   ? Depression   ? Suicidal ideation   ? MDD (major depressive disorder), single episode, severe with psychotic features (HCC/RAF)       HPI:   Tyler Mahoney is a 61 y.o. male with the above issues, presents with weakness and loss in muscle mass in the both gluteal region and both thighs that he first noticed about 1 month ago.  No pain in the lower back, gluteal or thigh regions.  R knee pain since 12/2021 that has prevented him from running.  Seen by 3 different orthopedic surgeons in the past for his R knee pain.  Tried PT and steroid injection which did not help.  Pain mostly on the inferior pole of the patella.  Reportedly had an MRI of the knee that showed signs of patellar tendinitis.  Had MRI of the brain and C/T/L spine all done outside of [place] that were reportedly unremarkable, no images or reports for me to review today.  Seen by a neurologist and reportedly had normal LP, negative for MS.     Review of Systems  General: no fevers, chills  Psychological: negative  Ophthalmic: no vision changes  ENT: no nasal congestion, rhinorrhea, or sore throat  Respiratory: no cough, shortness of breath, or wheezing  Cardiovascular: no chest pain  Gastrointestinal:  no abdominal pain  Musculoskeletal: see HPI  Neurological: no dizziness  Dermatological: no rash    Past Medical History  He has a past medical history of Anxiety (05/04/2022), Depression (05/04/2022), Hyperlipidemia (03/20/2012), Hypertension (03/20/2012), and Memory problem (05/04/2032).    Surgical History  He has a past surgical history that includes Vasectomy (08/26/96).    Family History  His family history includes Depression in his brother and mother; Hypertension in his father.    Social History  Social History     Occupational History   ? Not on file   Tobacco Use   ? Smoking status: Never   ? Smokeless tobacco: Never   Vaping Use   ? Vaping Use: Never used   Substance and Sexual Activity   ? Alcohol use: Not Currently     Alcohol/week: 6.0 oz     Types: 4 Drinks Containing 1.5 oz of alcohol per week   ? Drug use: Yes     Frequency: 1.0 times per week     Types: Marijuana   ? Sexual activity: Yes     Partners: Female     Birth control/protection: Vasectomy     Social History     Social History Narrative   ? Not on file       Immunization History   Administered Date(s) Administered   ? COVID-19, mRNA, (Moderna) 100 mcg/0.5 mL 10/23/2019, 11/20/2019, 08/03/2020   ? Influenza Vaccine, Pediatric, Trivalent, With Preservative 06/06/2013   ? Tdap 04/03/2013       Medications/Supplements  No outpatient medications have been marked as taking for  the 06/14/22 encounter (Office Visit) with Gillis Ends, MD.       Objective:     Physical Exam  BP 179/84  ~ Pulse 60  ~ Temp 36.4 ?C (97.6 ?F) (Temporal)  ~ Wt 132 lb (59.9 kg)  ~ BMI 22.66 kg/m?     General: alert, well appearing, and in no distress.  Head: Atraumatic, normocephalic  Eyes: EOMI, conjunctiva normal  Resp: symmetrical chest rise, no retractions  CV: well perfused, no cyanosis  MSK:  Normal muscle mass of the thighs, no significant muscle atrophy.  Strength at the hip, knee 5/5 bilaterally.  FROM back without pain.    Neuro: alert, oriented, no focal deficits  Skin: no rash, erythema, or breaks in skin  Psych:  Normal affect, insight, and judgment.       Assessment/Plan:     Tyler Mahoney is a 61 y.o. male who presents with concern for muscle atrophy of the gluteal and thigh muscles.  I think his decrease in muscle mass is likely secondary to relative deconditioning as he has not been able to do his usual lower extremity workouts including running for the past 6 months secondary to his right knee pain rather than something pathological.  Per patient report, he has already had an MRI of his brain, C/T/L-spine that were unremarkable.  He also had an LP that was unremarkable for multiple sclerosis.  Also had MRI of the right knee that showed possible patellar tendinitis.  None of these images or reports are available for me to review today.  I encouraged him to get these to me for review.  It seems that he has a lot of anxiety related to his decrease in muscle mass in the lower extremities.  Pt encouraged to try PT to address his right knee pain.    The above plan of care, diagnosis, orders, and follow-up were discussed with the patient.  Questions related to this recommended plan of care were answered.     45 minutes were spent personally by me today on this encounter which include today's pre-visit review of the chart, obtaining appropriate history, performing an evaluation, documentation and discussion of management with details supported within the note for today's visit. The time documented was exclusive of any time spent on the separately billed procedure.           Gillis Ends, MD  06/14/2022 at 4:38 PM

## 2022-06-20 ENCOUNTER — Ambulatory Visit: Payer: BLUE CROSS/BLUE SHIELD

## 2022-06-20 ENCOUNTER — Telehealth: Payer: BLUE CROSS/BLUE SHIELD

## 2022-06-20 DIAGNOSIS — F32A Depression, unspecified depression type: Secondary | ICD-10-CM

## 2022-06-20 DIAGNOSIS — N529 Male erectile dysfunction, unspecified: Secondary | ICD-10-CM

## 2022-06-20 DIAGNOSIS — F459 Somatoform disorder, unspecified: Secondary | ICD-10-CM

## 2022-06-20 NOTE — Progress Notes
[facility] CONSULT / HISTORY AND PHYSICAL - [place]    PATIENT:  Tyler Mahoney  MRN:  [id number]  DATE: 06/20/2022    REFERRING PRACTITIONER: Tamsen Snider., MD  PRIMARY CARE PROVIDER: Tamsen Snider., MD    Reason for consult: No chief complaint on file.      Patient Consent to Telehealth   The patient agreed to participate in the video visit prior to joining the visit.          History of Present Illness:  Tyler Mahoney is a 61 y.o. male who  has a past medical history of Anxiety (05/04/2022), Depression (05/04/2022), Hyperlipidemia (03/20/2012), Hypertension (03/20/2012), and Memory problem (05/04/2032). Neurology consult was requested for multiple concerns including heat intolerance and muscle atrophy    Arber Age?is a 61 y.o.?male?with no formal psychiatric history (possible undiagnosed depression in his teens in the setting of mother passing from lung cancer, no hospitalizations, no suicide attempts) who presents due to worsening ruminative anxiety, multiple somatic symptoms, chronic insomnia leading to passive SI and hopelessness; consult for DTS.    Interview conducted with patient and brother at bedside. Patient describes?that he injured his knee a few months ago and since has had a constellation of medical concerns which have caused debilitating anxiety. ?Patient describes he is noticed he is more sensitive to the sun, has had worsening blood pressure, has experienced weight loss despite overeating, has had more frequent urination, has had a unilateral cold foot, has had eye twitching, has had constipation, has had some muscular weakness and atrophy of his legs --?all leading to thinking that he has MS. ?He describes that he has undergone multiple extensive workups including an outpatient MRI &?a spinal tap while being admitted?to?an outside hospital. ?Results have been reassuring that he does not have MS or another neurological condition. ?He describes that he started seeing a psychiatrist, Dr. Birdie Riddle, who has prescribed multiple medications. ?Per chart he has trialed Cymbalta, Wellbutrin, Seroquel, trazodone, Ativan, Lunesta, gabapentin, Klonopin.??He reports that in the last few weeks he is not been able to sleep more than 2-3 hours of sleep at night. ?Despite not getting sleep he remains active during the day. ?    Brother at bedside describes that he has had to come down to [place] from [place] twice because of ruminative anxiety. ?Brother describes that patient paces swears that himself is ruminative that he has done something to himself. ?Patient states that he believes he is either developing MS or that erectile dysfunction medication he got from a gas station and took over the last decade have been causing these neurological symptoms. ?Overall patient seems to endorse extreme feelings of guilt and hopelessness, increased appetite, insomnia,?psychomotor agitation, and passive suicidal ideation. ?He reports ?I can not go on like this.''When asked about a plan, he states?''I do not have one right now.?I suppose I could maybe overdose on medications I have.? ?Patient denies AH/VH/HI.??Patient denies substance use. Denies any other neuromuscular symptoms including oculomotor dysfunction, fasciculations, or bulbar impairment.    He was most recently admitted for inpatient hospitalization for mood stabilization, and medication management. He is currently on Zyprexa 5 mg nightly+ 2.5 mg PRN during day , Zoloft 25 mg, and Hydroxyzine 50 mg daily. He is currently accepted into an outpatient rehabilitation program.      Patient Active Problem List    Diagnosis Date Noted   ? Suicidal ideation 06/05/2022   ? MDD (major depressive disorder), single episode, severe with psychotic features (HCC/RAF) 06/05/2022   ?  Depression 05/23/2022   ? Adenomatous colon polyp 05/22/2022   ? Anxiety 05/22/2022   ? Erectile dysfunction 05/22/2022   ? Essential hypertension 05/22/2022   ? Hyperlipidemia 05/22/2022       Past Medical History:   Diagnosis Date   ? Anxiety 05/04/2022   ? Depression 05/04/2022   ? Hyperlipidemia 03/20/2012    Thats an estimate   ? Hypertension 03/20/2012    Estimate   ? Memory problem 05/04/2032       No outpatient medications have been marked as taking for the 06/20/22 encounter (Telemedicine) with [place], [place], DO.         No Known Allergies     Family History   Problem Relation Age of Onset   ? Depression Mother    ? Hypertension Father    ? Depression Brother        Social History     Socioeconomic History   ? Marital status: Unknown   Tobacco Use   ? Smoking status: Never   ? Smokeless tobacco: Never   Vaping Use   ? Vaping Use: Never used   Substance and Sexual Activity   ? Alcohol use: Not Currently     Alcohol/week: 6.0 oz     Types: 4 Drinks Containing 1.5 oz of alcohol per week   ? Drug use: Yes     Frequency: 1.0 times per week     Types: Marijuana   ? Sexual activity: Yes     Partners: Female     Birth control/protection: Vasectomy   Other Topics Concern   ? Do you exercise at least a day, 3 or more days a week? Yes     Comment: But not in last 5 month   ? Types of Exercise? (List in Comments) Yes     Comment: Running surfing   ? Do you follow a special diet? No   ? Vegan? No   ? Vegetarian? No   ? Pescatarian? No   ? Lactose Free? No   ? Gluten Free? No   ? Omnivore? No        Review of Systems:  Other than noted above, a full 14-point review of systems was reviewed and is negative for: General, Eyes, ENMT, respiratory, cardiovascular, GI, GU, musculoskeletal, allergy/immunology, endocrinology, hematology, skin, neurologic, and psychiatric.      Objective:      There were no vitals filed for this visit.     General: well-appearing, well-nourished, appears stated age, NAD, cooperative  HEENT: NCAT  Eyes: No scleral icterus or injection  Pulm: no respiratory distress  Extrem: no clubbing, cyanosis or edema  Skin: no worrisome rashes  Heme: No bruising    Neurological Exam  Mental Status: Alert. Speech spontaneous and fluent, intact comprehension.   Cranial Nerves:  EOMI, face symmetric with intact smile, eye closure, eyebrow raise, hearing grossly intact. No dysarthria  Motor:  Normal bulk. No pronator drift  Sensory:  N/A  Reflexes:  N/A  Coordination:  N/A  Gait:  deferred            Lab Results   Component Value Date    WBC 6.22 06/04/2022    HGB 13.9 06/04/2022    HCT 41.1 06/04/2022    MCV 90.5 06/04/2022    PLT 264 06/04/2022     Lab Results   Component Value Date    CREAT 1.08 06/04/2022    BUN 17 06/04/2022    NA 137 06/04/2022  K 3.8 06/04/2022    CL 107 (H) 06/04/2022    CO2 19 (L) 06/04/2022     No results found for: ''HGBA1C''  Lab Results   Component Value Date    ALT 59 06/04/2022    AST 49 06/04/2022    ALKPHOS 68 06/04/2022    BILITOT 0.2 06/04/2022     Lab Results   Component Value Date    TSH 0.85 06/06/2022     Lab Results   Component Value Date    CALCIUM 8.9 06/04/2022    PHOS 3.9 06/04/2022     Lab Results   Component Value Date    CHOL 169 06/06/2022    CHOLHDL 58 06/06/2022    CHOLDLCAL 82 06/06/2022    TRIGLY 147 06/06/2022            Assessment:      Tyler Mahoney?is a 61 y.o.?male?with no formal psychiatric history (possible undiagnosed depression in his teens in the setting of mother passing from lung cancer, no hospitalizations, no suicide attempts) who presents due to worsening ruminative anxiety, multiple somatic symptoms, chronic insomnia leading to passive SI and hopelessness; consult for discussion of multiple neurological symptoms.    During our interview, patient continuously was fixated on possibility of a degenerative somatic medical condition which could explain his muscle atrophy (which I explained multiple times is secondary to deconditioning and disuse given his debilitating anxiety). He describes being in a ''fight or flight'' response for the past 7 weeks and is endorsing extreme levels of anxiety, guilt , and depression. Thus far, given the level of work up he has completed he has completed during various trips to the hospital (including MR imaging of spine and brain), there is no indication to suggest an organic or neurological cause for his symptoms. He previously felt that he had MS and had extensive work up (including an LP) which was negative.     I explained that his symptoms are likely psychosomatic and/or reflective of illness anxiety disorder and that he cannot expect to get better or return to his prior level of functioning without addressing his mental issues first. I explained over and over that he has no medical concern thus far to indicate an inability to regrow his muscles-I explained that his muscle atrophy is secondary to lack of exercise and deconditioning, He continued to perseverate and show me his legs to try and emphasize his point.    I recommended that he continuing following the advice of his psychiatrist and other mental health professionals. Given that he seems fixated on muscle atrophy and deconditioning, we will perform a EMG/NCS to again rule out any other causes of atrophy and to provide the patient with even more evidence to suggest no neurodegenerative process.    1. Anxiety    2. Erectile dysfunction, unspecified erectile dysfunction type    3. Essential hypertension    4. Depression, unspecified depression type    5. Somatoform disorder      Plan/ Recommendation:      Plan   Orders Placed This Encounter   ? ElectroMyogram/Nerve Conduction Study (EMG/NCS)       No follow-ups on file.    The above recommendation were discussed with the patient.  The patient had all questions answered satisfactorily and is in agreement with this recommended     Thank you for asking me to participate in the care of this patient.    Total minutes spent today including review of tests/notes, obtaining history,  performing exam, counseling/educating patient/family, ordering tests/medications, communicating with other physicians, charting, independently interpreting results and communicating test results to patient/family = 60 minutes     Author: Lacretia Nicks 06/20/2022 9:01 AM               Department of Neurology      [facility]

## 2022-06-21 ENCOUNTER — Telehealth: Payer: BLUE CROSS/BLUE SHIELD

## 2022-06-21 NOTE — Telephone Encounter
Tried calling patient to advise him of email below but mail box is full.

## 2022-06-21 NOTE — Telephone Encounter
Call Back Request      Reason for call back:  Patient is calling to schedule an EMG. Referral is in CC.     CBN: [phone]    Any Symptoms:  []  Yes  [x]  No       If yes, what symptoms are you experiencing:    o Duration of symptoms (how long):    o Have you taken medication for symptoms (OTC or Rx):      If call was taken outside of clinic hours:    [] Patient or caller has been notified that this message was sent outside of normal clinic hours.     [] Patient or caller has been warm transferred to the physician's answering service. If applicable, patient or caller informed to please call us back if symptoms progress.  Patient or caller has been notified of the turnaround time of 1-2 business day(s).

## 2022-06-23 NOTE — Telephone Encounter
I have called the patient in an attempt to Book EMG appointment, unfortunately I was not able to get a hold of the patient. I have left a detailed voicemail asking the patient to call the office back DA      MESSAGE COMPLETE   Message Complete, No further Action Required.    Ok to [employer]

## 2022-06-27 ENCOUNTER — Telehealth: Payer: BLUE CROSS/BLUE SHIELD

## 2022-06-27 NOTE — Telephone Encounter
PDL Call to Clinic    Reason for Call:  Pt's brother Eddie Dibbles is f/u regarding messaged below.    June 23, 2022  Tyler Mahoney     DA   06/23/22 10:00 AM  Note    I have called the patient in an attempt to Book EMG appointment, unfortunately I was not able to get a hold of the patient. I have left a detailed voicemail asking the patient to call the office back DA      MESSAGE COMPLETE   Message Complete, No further Action Required.    Ok to DONE message          Appointment Related?  [x]  Yes  []  No     If yes;  Date:  Time:    Call warm transferred to PDL: [x]  Yes  []  No    Call Received by Clinic Representative:David  If call not answered/not accepted, call received by Patient Services Representative:

## 2022-06-28 ENCOUNTER — Inpatient Hospital Stay: Payer: BLUE CROSS/BLUE SHIELD | Attending: Sports Medicine

## 2022-06-28 ENCOUNTER — Ambulatory Visit: Payer: BLUE CROSS/BLUE SHIELD | Attending: Sports Medicine

## 2022-06-28 ENCOUNTER — Ambulatory Visit: Payer: BLUE CROSS/BLUE SHIELD

## 2022-06-28 DIAGNOSIS — M25561 Pain in right knee: Secondary | ICD-10-CM

## 2022-06-28 DIAGNOSIS — Z719 Counseling, unspecified: Secondary | ICD-10-CM

## 2022-06-28 NOTE — Patient Instructions
Please send copies of MRI and other knee imaging reports     ACTIVITY MODIFICATION:   -Avoid squats/lunges over 40 degrees  -If cleared to exercise by neurology, encourage continued or starting low impact physical activity such as water exercises and stationary cycling if cleared for exercise by PCP/Cardiologist  -Stop with any swelling, locking, catching (as described by Dr. Olena Leatherwood), or uncontrolled pain and contact the physician office.      10-1-1 RULE  10 - No pain that last more than 10 minutes with activity    1 - No pain that lasts more than 1 hour after activity    1 - No pain noticed 1 day after activity (the next morning upon waking)  - If one of the above occurs, cut the volume of activity in half the next time you exercise.    - If it occurs 2 days in a row, take 2 days off (continue rehab work as tolerated).  - If it recurs upon resuming activity, stop and call physician office (continue rehab work as tolerated    PAIN CONTROL   -Apply icepacks for 10 minutes, 3 times a day as needed for pain and after activity  -Reviewed an anti-inflammatory diet high in Omega-3 fatty acids and antioxidants    -Nonsteroidal anti-inflammatories as prescribed or advised.   -Turmeric 1-3 g a day with black pepper as NSAID alternative   -Reviewed the use and options for Hyaluronic acid, corticosteroid injections    REHABILITATION  -Home Exercise Program   -Referral to physical therapy previously placed for quad strengthening and gait training     FOLLOW-UP  - Neurology as scheduled  - Sports Medicine in 6 weeks or as needed    PHYSICAL THERAPY    NOTE:     Below is a list of physical therapy offices outside of [place]. You do not have to schedule with one on the list. It is only to help you start your search. Physical therapy offices on this list may or may not accept your insurance. Your insurance company may also be able to help you find a physical therapy office.     Before scheduling an appointment with any practice, we recommend that you check to see if they accept your insurance to help determine the approximate out of pocket cost for your therapy. In other words, don't just ask if the physical therapy practice accepts your insurance. Ask them what your financial obligation is. That may mean the difference between a $20 co pay per visit and being responsible for 80% of the total cost of your therapy. If needed, we also recommend that you call your insurance company for assistance in finding a local therapy office as well.     If you choose a practice outside of [place], please send a message to our office with the name and contact information of the physical therapy practice so that we can forward your prescription to them.    CONTACT NUMBERS FOR SCHEDULING PHYSICAL THERAPY AT A [place] LOCATION:  Barrie Dunker [facility]), [facility] [address] and [facility]: [phone]  [place]: [phone]  [place]: [phone]  [facility]: [phone]  [place]: [phone]  [place]: [phone]    PHYSICAL THERAPY OPTIONS OUTSIDE OF [facility]  MOTI Physiotherapy: [email or web address]  [facility]  [address] Floor 1  [place], [place] [address]  [phone]  (Fax) [phone]  [email or web address]    [facility]  [address]  [place],  [place] [address]  [phone]  (Fax) [phone]  [email or web address]    [facility]  [address]  [place], [place] [address]  [phone]  (Fax) [phone]  [email or web address]      [facility]  [address]  [place], [place] [address]  [phone]  [email or web address]    [facility]  [facility]  [address]  7th Floor  [place], [place] [address]  [phone]  [email or web address]    [facility]  [email or web address]  Partial List of Locations:  [place]  [address]  [place], [place] [address]  [phone]    [place] [facility]  [address] [place], [place] [address]  [phone]  [address]  [place], [place] [address]  [phone]    [place]  [address]  [place], [place] [address]  [phone]    [facility]  Multiple Locations   Therapists also can potentially come to the home, office, or gym.  Check [email or web address] for best location for you.    [address]  [place], [place] [address]  [phone]    [address]  [place], [place] [address]  [phone]    [facility] Physical Therapy  [address] [place] [address]  [phone]  [email or web address]    [facility] Physical Therapy and Sports Performance  [address] [place], [place] [address]  [phone]    [facility] (Has accepted [facility] insurance)  Multiple Locations   [email or web address]    Rehabilitation Specialists (Has accepted HMO insurance)  Multiple Locations - [place], [place] (has Aquatics therapy), [facility]  [email or web address]    [place]     [facility] Therapy  [address] [place], [place] [address]  Ph: [phone] ~ Fax: [phone]    *[facility]  Ph: [phone] ~ Fax: [phone]  [address] [place], [place] [address]    *[facility]   [address] [place], [place] [address]  Ph: [phone] ~ Fax: [phone]    *Independent Physical Therapy  Central Ph: [phone] (option [phone]) ~ Fax: [phone]  [place]: [address] [place], [place] [address]  [place]: [address] [place], [place] [address]  [facility]: [address] [place], [address] [place], [place] [address]  [facility]: [address] [place], [place] [address]    *[facility] Physical Therapy and Sports Rehabilitation (Preferred)  [address] [place], [place] [address]  Ph: [phone] ~ Fax: [phone]    [facility]   [address] [place] [place], [place] [address]  Ph: [phone] ~ Fax: [phone]    [facility] Physical Therapy and Sports Rehabilitation  [address] [place], [place] [address]  Ph: [phone] ~ Fax: [phone]    *[facility]  [address] [place], [place] [address]  Ph: [phone] ~ Fax: [phone]    *Physiotherapy Associates   [facility]: [address] [place], [place] [address]  Ph: [phone] ~ Fax: [phone]    [facility]  [address] [place], [place] [address]  Ph: [phone] ~ Fax: [phone]    *[facility]  [address] [place], [place] [address]  Ph: [phone] ~ Fax: [phone]    [place] / Fonnie Jarvis Physical Therapy (Preferred)  [address] [place], [place] [address]  Ph: [phone] ~ Fax: [phone]    [facility] Physical Therapy (Preferred)  [address] [place], [place] [address]  Ph: [phone] ~ Fax: [phone]    *[facility] Fitness  [address] [facility] [address] [place], [place] [address]  Ph: [phone] ~ Fax: [phone]    *Ellender Hose Physical Therapy  [address] [place], [place] [address]  Ph: [phone] ~ Fax: [phone]    [facility] Physical Therapy  [address] [place], [place] [address]  Ph: [phone] ~ Fax: [phone]    [facility] Physical Therapy  [address] [place], [place] [address]  Ph: [phone] ~ Fax: [phone]    *Rehab Specialist Physical Therapy  [facility]: [address], [place], [place] [address]  Ph: [phone] ~ Fax: [phone]    [facility] Physical Therapy  [address] [place], [place] [address]  Ph: [phone] ~ Fax: [phone]    *Postureworks    [address] [place], [place] [address]  Ph: [phone] ~ Fax: [phone]    *[facility] Therapy[facility]  [address] [place], [place] [address]  Ph: [phone] ~ Fax: [phone]    *[facility]  [address] [place], [place] [address]  Ph: [phone] ~ Fax: [phone]    [facility]  [address], Carrie Mew [place], [place] [address]  Ph: [phone] ~ Fax: [phone]    *Independent Physical Therapy  Central Ph: [phone] (option 1) ~ Fax: [phone]  [place]: [address] [place], [place] [address]    [place] / [place] / [place] / DOWNTOWN    *Back 2 Health Physical Therapy (Aquatic Therapy)   [place]: [phone] [address] [place], [place] [address]  Ph: [phone] ~ Fax: [phone]    *Rehab Specialist Physical Therapy  [facility]: [address] [place], [place] [address]  Ph: [phone] ~ Fax: [phone]    [facility] Physical Therapy  [address] [place], [place] [address]   Ph: [phone] ~ Fax: [phone]    *[facility]odies in Balance   [address] [place], [place] [address]  Ph: [phone] ~ Fax: [phone]    Teressa Senter Physical Therapy  [address] [place], [place] [address]  Ph: [phone] ~ Fax: [phone]    [facility] Rumm[facility] ([place])  [address] [place], [place] [address]  Ph: [phone] ~ Fax: [phone]    *Stay Ready Physical Therapy [facility] [place])   [address] [place], [place] [address]  Ph: [phone] ~ Fax: [phone]    *[facility]  [address] [place], [place] [address]  Ph: [phone] ~ Fax: [phone]    *[facility] Physical Therapy [facility]   [address], [address] [place], [place] [address]  Ph: [phone] ~ Fax: [phone]    [facility]  [address] [place], [place] [address]  Ph: [phone] ~ Fax: [phone]    *[facility]  [address] [place], [place] [address]  Ph: [phone] ~ Fax: [phone]    *[facility]  [address] [place], [place] [address]  Ph: [phone] ~ Fax: [phone]    *[facility]usion [facility]   [place]: [address] [place], [place] [address]  Ph: [phone] ~ Fax: [phone]    *Physiotherapy Associates   [facility]: [address], 7th Floor [place], [place] [address]  Ph: [phone] ~ Fax: [phone]    [place] / [place] / [place] / [place]    *[facility] [place])  [address] [facility] [address] [place], [place] [address]  Ph: [phone] ~ Fax: [phone]    *Athletic Physical Therapy   [facility]: [address] [place], [place] [address]  Ph: [phone] ~ Fax: [phone]    *[facility]   [address] [place] [address] [place], [place] [address]  Ph: [phone] ~ Fax: [phone]    *Independent Physical Therapy  Central Ph: [phone] (option 1) ~ Fax: [phone]  [facility]: [address] [place], [place] [address]  [facility] (new address): [address] [place], [place] [address]     *[facility] Physical Therapy [facility])  [address] [place], [address] [place], [place] [address]  Ph: [phone] ~ Fax: [phone]    *[facility]    [address] [facility] [place] [place], [place] [address]  Ph: [phone] ~ Fax: [phone]    [facility] Physical Therapy   [address] 2nd Floor [place], [place] [address]  Ph: [phone] ~ Fax: [phone]    [facility] Physical Therapy  [address] [place], [place] [address]  Ph: [phone] ~ Fax: [phone]    *Complete PT  [address] [place], [place] [address]  Ph: [phone] ~ Fax: [phone]    *Select Physical Therapy [facility])  [address] [place], [place] [address]  Ph: [phone] ~ Fax: [phone]    [facility] Therapy  Ph: [phone] ~ Fax: [phone]  [address] [place], [place] [address]    *Physiotherapy Associates   [address] [place], [place] [address] ?[facility]?  Ph: [phone] ~ Fax: [phone]    [facility]    *Physical Therapy Specialist   [address] [place], [place] [address]  Ph: [phone] ~ Fax: [phone]    [facility] Physical Therapy   [address] [place], [place] [address]  Ph: [phone] ~ Fax: [phone]    *Physiotherapy Associates   [facility]: [address] [place], [place] [address] ?[facility]?  Ph: [phone] ~ Fax: [phone]  Milinda Antis: [address] [place], [place] [address]  Ph: [phone] ~ Fax: [phone]    *Athletic Physical Therapy   [facility]: [address], Suite [place], [place] [address]  Ph: [phone] ~ Fax: [phone]  [place]: [address] [place], [place] [address]  Ph: [phone] ~ Fax: [phone]  [facility]: [address] [place], [place] [address]  Ph: [phone] ~ Fax: [phone]  [facility]: [address] Harrietta Guardian [place], [place] [address]  Ph: [phone] ~ Fax: [phone]  [facility]: [address] [place], [place] [address]  Ph: [phone] ~ Fax: [phone]    Owens Loffler Physical Therapy  [place]: [address] [place], [place] [address]  Ph: [phone] ~ Fax: [phone]  [facility]: [address] [facility]. [place], [place] [address]  Ph: [phone] ~ Fax: [phone]  [place]: [address] [place], [place] [address]  Ph: [phone] ~ Fax: [phone]  [place]: [address] [place], [place] [address]  Ph: [phone] ~ Fax: [phone]  Kerin Perna: [address] [place], [place] [address]  Ph: [phone] ~ Fax: [phone]  [place]: [phone] [address] [employer] [address] [employer]. [place], [place] [address]  Ph: [phone] ~ Fax: [phone]    [facility] Physical Therapy (Preferred)  [address] [place], [place] [address]  Phone: [phone] ~ Fax: [phone]    *[facility]  Kerin Perna: [address] [place], [address] [place], [place] [address]  Ph: [phone] ~ Fax: [phone]    *Independent Physical Therapy  Central Ph: [phone] (option [phone]) ~ Fax: [phone]  Kerin Perna: [address] [place], [place] [address]    *[facility] (Preferred)  [place]: [phone] [address] [place], [place] [address]  Ph: [phone] ~ Fax: [phone]  [place]: [address] [place], [place] [address]  Ph: [phone] ~ [phone]    [facility] Physical Therapy (Preferred)  [address] [place], [place] [address]  Ph: [phone] ~ Fax: [phone]    *[facility]  [address] [place], [place] [address]  Ph: [phone] ~ Fax: [phone]    *Rehab Specialist Physical Therapy  [place]: [address], [address] [place], [place] [address]  Ph: [phone] ~ Fax: [phone]     [facility] Phenix [facility] Physical Therapy  [address] [place], [place] [address]  Ph: [phone] ~ Fax: [phone]    *[facility] Physical Therapy & Fitness Center  [address] [place], [place] [address]  Ph: [phone] ~ Fax: [phone]    *Body [facility] Sports Therapy (Preferred)  [address] [place], [place] [address]  Ph: [phone] ~ Fax: [phone]    Franco Collet Physical Therapy  Ph: [phone] ~ Fax: [phone]  [address] [place], [place] [address]    *[facility] Physical Therapy (Preferred)  [address] [place], [address] [place], [place] [address]   Ph: [phone] ~ Fax: [phone]    *[facility] Physical Therapy & Wellness  [address] [place], [place] [address]  Ph: [phone] ~ Fax: [phone]    *Back 2 Health Physical Therapy (Aquatic Therapy)   [place]: [address] [place], [place] [address]  Ph: [phone] ~ Fax: [phone]    *Physiotherapy Associates   [address] [place], [place] [address] ?[facility]?  Ph: [phone] ~ Fax: [phone]    *[facility]  [address] [place], [place] [address]  Ph: [phone] ~ Fax: [phone]    [place] / Bethanne Ginger    *ATP Training and Physical Therapy  [address] [place], [place] [address]  Ph: [phone] ~ Fax: [phone]    Driscilla Moats Physical Therapy   [address] [place], [place] [address]  Ph: [phone] ~ Fax: [phone]    *Select [facility] Therapy  [address] [place], [place] [address]  Ph: [phone] ~ Fax: [phone]    *Physiotherapy Associates   [place]: [phone] S[address] [place], [place] [address] ?[facility]?  Ph: [phone] ~ Fax: [phone]    Brien Mates Physical Therapy  [address]  [place], [place] [address]  Ph: [phone] ~ Fax: [phone]    *[facility]  [address]  [place], [place] [address]  Ph: [phone] ~ Fax: [phone]    [facility] Physical Thearpy  [address] [place], [place] [address]  Ph: [phone] ~ Fax: [phone]    [facility]    [facility] Physical Therapy  [address] [place], [place] [address]  Ph: [phone] ~ Fax: [phone]    [facility] Physical Therapy   [address] Suite [place], [place] [address]  Ph: [phone] ~ Fax: [phone]    [place] / [facility]    [facility] Physical Therapy  [address] [place], [place] [address]  Ph: [phone] ~ Fax: [phone]    *Professional [facility]  [address] [place], [place] [address]  Ph: [phone] ~ Fax: [phone]    [facility] Physical Therapy, Inc[facility]  [address] [place], [place] [address]  Ph: [phone] ~ Fax: [phone]    [facility] Physical Therapy - [facility]  [address] [place], [address] [place], [place] [address]  Ph: [phone] ~ Fax: [phone]    *[facility] Physical Therapy  [address] [place], [place] [address]  Ph: [phone] ~ Fax: [phone]    [facility] Physical Therapy and Sports Medicine  [address] [place], [place] [address]  Ph: [phone] ~ Fax: [phone]    [facility]   [address] [place], [place] [address]  Ph: [phone] ~ Fax: [phone]    [facility] Sports Therapy, [facility] Therapy (Preferred)   [address] [place], [place] [address]  Ph: [phone] ~ Fax: [phone]    *ProSport Physical Thearpy (Preferred)  [facility]: [address] [place], [place] [address]  Ph: [phone] ~ Fax: [phone]   [facility]: [address] [place], [address] [place], [place] [address]  Ph: [phone] ~ Fax: [phone]  [facility]: [address] [place], [place] [address]  Ph: [phone] ~ Fax: [phone]    *Physiotherapy Associates   [place]: [address] [place], [address] [place], [place] [address]  Ph: [phone] ~ Fax: [phone]    Sharlyne Cai / [place] / [place]    *Owens Loffler Physical Therapy  [facility]: [address] [place] [address]  Ph: [phone] ~ Fax: [phone]  [place]: [phone] [place], [place] [address]  Ph: [phone] ~ Fax: [phone]  [place]: [address] [place], [place] [address]  Ph: [phone] ~ Fax: [phone]    [facility] Therapy[facility]  [address] [place], [place] [address]  Ph: [phone] ~ Fax: [phone]     [facility] Physical Therapy  [address] [place], [place] [address]  Ph: [phone] ~ Fax: [phone]    [facility] Physical Therapy  [address] [place] [place], [place] [address]  Ph: [phone] ~ Fax: [phone]    Leretha Pol [facility]  [address] [place], [place] [address]  Ph: [phone] ~ Fax: [phone]    [facility] [place]    *[facility] Medicine and Rehabilitation Therapy  Therapists: Frederich Cha. Roderick Pee DPT and Darin A. Siebert DPT  [address] [place], [place] [address]  Ph: [phone] ~ Fax: [phone]  [address] [place], [place] [address]  Ph: [phone] ~ Fax: [phone]    *Dorothea Glassman Physical Therapy and Sports Rehabilitation  [address] [place], [place] [address]  Ph: [phone] ~ Fax: [phone]    [facility] Physical Therapy  [address] [place], [place] [address]  Ph: [phone] ~ Fax: [phone]    *[facility] Physical Therapy  Ph: [phone] ~ Fax: [phone]   [address] [place], [place] [address]

## 2022-06-28 NOTE — Progress Notes
[facility]  Division of Sports Medicine    SPORTS MEDICINE OFFICE NOTE        Date: 06/28/2022    Referred by:  Referral, Self  [address] [place],  [place] [address]  Primary Care Physician:  Tamsen Snider., MD    SUBJECTIVE:     Tyler Mahoney is a 61 y.o. male with anxiety and depression presenting with his brother with complaint of  right anterolateral knee pain just lateral to patella tendon as well as concern for quad and glute atrophy. Per brother patient runs every day, at one point 325 days in a row until about 6 month ago when patient had gradual onset of above knee pian. Denies specific trauma but pain was occurring during his runs and building until he stopped running. Denies swelling, locking, or catching. Not waking him at night. Patient saw three different orthopedic physicians outside of [place] including Dr. Lieutenant Diego at [facility]. States he had 2 MRIs of his knees. He brought disc with images of the two MRIs but they were unavailable at the time of the office visit. He did not have MRI reports. 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 States he received a corticosteroid injection to his right knee by an orthopedic physician in [place] approximately August 31 (~10 weeks ago) without change in symptoms.  Patient has not run in about 13 weeks due to this problem. Patient states he walks but knee pain returns when he starts running.    Per patient and his brother, patient is most concerned about the subjective weakness/loss of muscle definition in his quads and glutes that have occurred since he has decreased his running. Per his brother patient is fearful he has ALS. This has been a significant source of anxiety for him. He is currently living in a facility for in patient treatment for his anxiety. He will be there fore several more weeks per his brother. He has seen neurology - [place], eval for MS. Per recent office note from Dr. Wesley Blas, sports medicine at [facility], patient had MRI of the brain and C/T/L spine all done outside of [facility] that were reportedly unremarkable. Patient and brother confirm this. Seen by a neurologist and reportedly had normal LP, negative for MS.   Seen at [facility] By Dr. Karie Kirks, neurology who reassured patient that his concern for muscle weakness was secondary to deconditioning and disuse given his debilitating anxiety. She felt that given the level of workup during various trips to hospitals, there was no indication to suggest an organic or neurological cause for his symptoms. She did order an EMG/NCS given patient's significant concern. He has a follow-up appointment with her next week..    Patient seen by Dr. Wesley Blas, primary care sports at [facility], who agreed with Dr. Karie Kirks regarding weakness and recommended formal physical therapy, however it does not appear he wrote a referral for this.     Pain is increased with: repetitive motion (running)  Pain is decreased with: rest/time    Medications: meloxicam  Bracing: None  Formal physical therapy:  2 rounds [facility], last was few months ago,   Home Exercise Program: previous;y. None at this time  Injections: corticosteroid injection 04/21/22    Prior injuries? Denies    At baseline patient run daily, surfing almost daily, and volleyball     The patient has no other acute complaints at  this time and otherwise feels well.    Past Medical History: I have reviewed and confirmed the past medical history in the chart.  Past Medical History:   Diagnosis Date   ? Anxiety 05/04/2022   ? Depression 05/04/2022   ? Hyperlipidemia 03/20/2012    Thats an estimate   ? Hypertension 03/20/2012    Estimate   ? Memory problem 05/04/2032       Past Surgical History:  Past Surgical History:   Procedure Laterality Date   ? VASECTOMY  08/26/96    Estimate       Medications: reviewed medication list in the chart  Current Outpatient Medications   Medication Sig   ? atorvastatin 40 mg tablet 1 tablet (40 mg total).   ? hydrOXYzine 50 mg capsule Take 1 capsule (50 mg total) by mouth every six (6) hours as needed.   ? LORazepam 2 mg tablet Take 0.5 tablets (1 mg total) by mouth at bedtime. Max Daily Amount: 1 mg   ? losartan 100 mg tablet Take 1 tablet (100 mg total) by mouth daily.   ? meloxicam 15 mg tablet Take 1 tablet (15 mg total) by mouth daily.   ? sertraline 100 mg tablet Take 1 tablet (100 mg total) by mouth daily.   ? OLANZapine 2.5 mg tablet Take 1 tablet by mouth daily as needed for anxiety (if hydroxyzine is not effective).   ? OLANZapine 5 mg tablet Take 1 tablet (5 mg total) by mouth at bedtime.   ? OLANZapine 7.5 mg tablet Take 1 tablet (7.5 mg total) by mouth at bedtime Pt reported. Total 12.5 mg dose .   ? sertraline 25 mg tablet Take 1 tablet (25 mg total) by mouth daily. (Patient not taking: Reported on 06/28/2022.)     No current facility-administered medications for this visit.       Allergies: reviewed allergy section in the chart    Family History:   Family History   Problem Relation Age of Onset   ? Depression Mother    ? Hypertension Father    ? Depression Brother        Social History:   Social History     Socioeconomic History   ? Marital status: Unknown   Tobacco Use   ? Smoking status: Never   ? Smokeless tobacco: Never   Vaping Use   ? Vaping Use: Never used   Substance and Sexual Activity   ? Alcohol use: Not Currently     Alcohol/week: 6.0 oz     Types: 4 Drinks Containing 1.5 oz of alcohol per week   ? Drug use: Yes     Frequency: 1.0 times per week     Types: Marijuana   ? Sexual activity: Yes     Partners: Female     Birth control/protection: Vasectomy   Other Topics Concern   ? Do you exercise at least a day, 3 or more days a week? Yes     Comment: But not in last 5 month   ? Types of Exercise? (List in Comments) Yes     Comment: Running surfing   ? Do you follow a special diet? No   ? Vegan? No   ? Vegetarian? No   ? Pescatarian? No   ? Lactose Free? No   ? Gluten Free? No   ? Omnivore? No       Review of Systems: A 14-point review of systems was performed.  With the  exception of the HPI, all other review of systems was negative.       PHYSICAL EXAM:     BP 151/77  ~ Pulse 62  ~ Ht 5' 4'' (1.626 m)  ~ Wt 131 lb 9.6 oz (59.7 kg)  ~ SpO2 98%  ~ BMI 22.59 kg/m?   General: Well-Appearing, no distress, cooperative  Eyes: No periorbital swelling, no periorbital erythema  Neck: Supple, no meningismus  Respiratory: No acute distress, No accessory use  Cardiovascular: 2+ DP pulses equal bilaterally, capillary refill normal and equal bilaterally  Skin:  Exposed areas warm and dry  Neurological: Alert and Oriented x3, Normal Speech  Psych: Normal mood, Normal affect    Right Knee:  General: symmetric  Antalgic Gait: negative  Two Legged Squat: without pain  Single Leg Squat: increased Q angle  Skin: normal.  Quadriceps: Symmetric  and No atrophy  Effusion: none  Patellar Testing:   Grind: Negative  Patella Facet Tenderness: Negative  Inhibition: Negative  Apprehension: Negative  Translation: 1+  Tracking: Crepitance     PALPATION AND RANGE OF MOTION TESTING   Range of Motion 0 - 120   Pain with hyperextension no   Pain with hyperflexion no   Pain with Passive Internal Hip Rotation no   Tenderness no     STABILITY TESTING   Valgus Testing at 0? flexion Negative   Valgus Testing at 30? flexion Negative   Varus Testing at 0? flexion Negative   Varus Testing at 30? flexion Negative   Pivot Shift Deferred   Lachman's Negative   Anterior Drawer Negative   Posterior Drawer Negative     Meniscal Testing:  Medial joint line tenderness: negative  Lateral joint line tenderness:  negative  McMurray test: negative    Strength/Function/Flexibility:   Ober's: Negative  Ely's positive  Abductor strength: 5/5  Hoffa's Impingement Testing: Negative   Neuro: Sensation intact over L2-5 dermatomes     Motor strength: Motor HF HE KE KF DF PF EHL   R 5 5 5 5 5 5 5    L 5 5 5 5 5 5 5        STUDIES:     MRIs of knee not available at time of visit      ASSESSMENT :     Vicente Weidler is a 61 y.o. 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  He is asymptomatic in the office today with a normal exam, therefore MRI images/reports would be helpful. He has not had weight bearing x-rays so we can order this today. He's likely improved. If he did indeed have hoffa's fat pad syndrome/patellar tendinopathy he would benefit from formal physical therapy, bracing/patella taping, for quad. He does not have overt weakness on exam today. While I defer this portion of the workup to neurology but any subjective weakness could very well be deconditioning due to his dramatic change in physical activity. When cleared from neurology, patient should begin some activity modification and work with physical therapist to returning to running. Per patient's brother he may not be able to start formal physical therapy until he finishes treatment for his anxiety much of which is centered around his concern for weakness.     PLAN:     Diagnoses and all orders for this visit:    Right knee pain, unspecified chronicity  -  XR knee ap+lat+tunnel+merchant standing right (4 views); Future      Please send copies of MRI and other knee imaging reports     IMAGING:  - Left knee x-rays     ACTIVITY MODIFICATION:   -Avoid squats/lunges over 40 degrees  -If cleared to exercise by neurology, would encourage continued or starting low impact physical activity such as water exercises and stationary cycling if cleared for exercise by PCP/Cardiologist  -Stop with any swelling, locking, catching (as described by Dr. Olena Leatherwood), or uncontrolled pain and contact the physician office.      10-1-1 RULE  10 - No pain that last more than 10 minutes with activity    1 - No pain that lasts more than 1 hour after activity    1 - No pain noticed 1 day after activity (the next morning upon waking)  - If one of the above occurs, cut the volume of activity in half the next time you exercise.    - If it occurs 2 days in a row, take 2 days off (continue rehab work as tolerated).  - If it recurs upon resuming activity, stop and call physician office (continue rehab work as tolerated    PAIN CONTROL   -Apply icepacks for 10 minutes, 3 times a day as needed for pain and after activity  -Reviewed an anti-inflammatory diet high in Omega-3 fatty acids and antioxidants    -Nonsteroidal anti-inflammatories as previously prescribed or advised.   -Turmeric 1-3 g a day with black pepper as NSAID alternative if not taking oral nonsteroidal antiinflammatories       BRACING:  - Will review imaging when available, if Hoffa's fat pad impingement, may benefit from patella taping by physical therapist or patella stabilizing brace.     REHABILITATION  -Home Exercise Program   -Referral to physical therapy     FOLLOW-UP  - Neurology as scheduled  - Sports Medicine in 6 weeks or as needed    All questions by the patient and brother answered by me to the best of my ability and they verbalized understanding and agreed with plan.      Don Perking, MD CAQSM  [facility] Assistant Clinical Professor  Department of [facility] Medicine  Division of Sports Medicine

## 2022-06-29 NOTE — Telephone Encounter
I have left a detailed voicemail asking the patient to call me back. Please transfer call DA

## 2022-07-04 ENCOUNTER — Ambulatory Visit: Payer: BLUE CROSS/BLUE SHIELD

## 2022-07-04 ENCOUNTER — Telehealth: Payer: BLUE CROSS/BLUE SHIELD

## 2022-07-04 NOTE — Telephone Encounter
Pt didn't answer and mailbox is full so can't a VM.   I will send out a mychart message  Pcc, if pt calls back, please confirm their appt    Thank you

## 2022-07-05 ENCOUNTER — Ambulatory Visit: Payer: BLUE CROSS/BLUE SHIELD

## 2022-07-05 DIAGNOSIS — F32A Depression, unspecified depression type: Secondary | ICD-10-CM

## 2022-07-05 DIAGNOSIS — N529 Male erectile dysfunction, unspecified: Secondary | ICD-10-CM

## 2022-07-05 DIAGNOSIS — F459 Somatoform disorder, unspecified: Secondary | ICD-10-CM

## 2022-07-05 NOTE — Progress Notes
[facility]    PATIENT:  Tyler Mahoney  MRN:  [id number]  DATE: 07/05/2022    REFERRING PRACTITIONER: No ref. provider found  PRIMARY CARE PROVIDER: Tamsen Snider., MD    Reason for consult:   Chief Complaint   Patient presents with   ? Anxiety     Follow-up for pt's condition.     Interval History:  -No interval change  -Still has some fixation to the ''atrophy'' of his quadriceps  -Now mentioning slight tremor in his right hand most notably antigravity  -Has twitching of his eyes when tired   -MRI C, T, L spine completed and reviewed showing mild degenerative changes- no central canal or neuroforaminal stenosis      History of Present Illness:  Tyler Mahoney is a 61 y.o. male who  has a past medical history of Anxiety (05/04/2022), Depression (05/04/2022), Hyperlipidemia (03/20/2012), Hypertension (03/20/2012), and Memory problem (05/04/2032). Neurology consult was requested for multiple concerns including heat intolerance and muscle atrophy    [name]?is a 61 y.o.?male?with no formal psychiatric history (possible undiagnosed depression in his teens in the setting of mother passing from lung cancer, no hospitalizations, no suicide attempts) who presents due to worsening ruminative anxiety, multiple somatic symptoms, chronic insomnia leading to passive SI and hopelessness; consult for DTS.    Interview conducted with patient and brother at bedside. Patient describes?that he injured his knee a few months ago and since has had a constellation of medical concerns which have caused debilitating anxiety. ?Patient describes he is noticed he is more sensitive to the sun, has had worsening blood pressure, has experienced weight loss despite overeating, has had more frequent urination, has had a unilateral cold foot, has had eye twitching, has had constipation, has had some muscular weakness and atrophy of his legs --?all leading to thinking that he has MS. ?He describes that he has undergone multiple extensive workups including an outpatient MRI &?a spinal tap while being admitted?to?an outside hospital. ?Results have been reassuring that he does not have MS or another neurological condition. ?He describes that he started seeing a psychiatrist, Dr. Birdie Riddle, who has prescribed multiple medications. ?Per chart he has trialed Cymbalta, Wellbutrin, Seroquel, trazodone, Ativan, Lunesta, gabapentin, Klonopin.??He reports that in the last few weeks he is not been able to sleep more than 2-3 hours of sleep at night. ?Despite not getting sleep he remains active during the day. ?    Brother at bedside describes that he has had to come down to [place] from [place] twice because of ruminative anxiety. ?Brother describes that patient paces swears that himself is ruminative that he has done something to himself. ?Patient states that he believes he is either developing MS or that erectile dysfunction medication he got from a gas station and took over the last decade have been causing these neurological symptoms. ?Overall patient seems to endorse extreme feelings of guilt and hopelessness, increased appetite, insomnia,?psychomotor agitation, and passive suicidal ideation. ?He reports ?I can not go on like this.''When asked about a plan, he states?''I do not have one right now.?I suppose I could maybe overdose on medications I have.? ?Patient denies AH/VH/HI.??Patient denies substance use. Denies any other neuromuscular symptoms including oculomotor dysfunction, fasciculations, or bulbar impairment.    He was most recently admitted for inpatient hospitalization for mood stabilization, and medication management. He is currently on Zyprexa 5 mg nightly+ 2.5 mg PRN during day , Zoloft 25 mg, and Hydroxyzine 50 mg daily.  He is currently accepted into an outpatient rehabilitation program.      Patient Active Problem List    Diagnosis Date Noted   ? Suicidal ideation 06/05/2022   ? MDD (major depressive disorder), single episode, severe with psychotic features (HCC/RAF) 06/05/2022   ? Depression 05/23/2022   ? Adenomatous colon polyp 05/22/2022   ? Anxiety 05/22/2022   ? Erectile dysfunction 05/22/2022   ? Essential hypertension 05/22/2022   ? Hyperlipidemia 05/22/2022       Past Medical History:   Diagnosis Date   ? Anxiety 05/04/2022   ? Depression 05/04/2022   ? Hyperlipidemia 03/20/2012    Thats an estimate   ? Hypertension 03/20/2012    Estimate   ? Memory problem 05/04/2032       Medications that the patient states to be currently taking   Medication Sig   ? atorvastatin 40 mg tablet 1 tablet (40 mg total).   ? hydrOXYzine 50 mg capsule Take 1 capsule (50 mg total) by mouth every six (6) hours as needed.   ? LORazepam 2 mg tablet Take 0.5 tablets (1 mg total) by mouth at bedtime. Max Daily Amount: 1 mg   ? losartan 100 mg tablet Take 1 tablet (100 mg total) by mouth daily.   ? meloxicam 15 mg tablet Take 1 tablet (15 mg total) by mouth daily.   ? OLANZapine 7.5 mg tablet Take 1 tablet (7.5 mg total) by mouth at bedtime Pt reported. Total 12.5 mg dose .   ? sertraline 100 mg tablet Take 1 tablet (100 mg total) by mouth daily.         No Known Allergies     Family History   Problem Relation Age of Onset   ? Depression Mother    ? Hypertension Father    ? Depression Brother        Social History     Socioeconomic History   ? Marital status: Unknown   Tobacco Use   ? Smoking status: Never   ? Smokeless tobacco: Never   Vaping Use   ? Vaping Use: Never used   Substance and Sexual Activity   ? Alcohol use: Not Currently     Alcohol/week: 6.0 oz     Types: 4 Drinks Containing 1.5 oz of alcohol per week   ? Drug use: Yes     Frequency: 1.0 times per week     Types: Marijuana   ? Sexual activity: Yes     Partners: Female     Birth control/protection: Vasectomy   Other Topics Concern   ? Do you exercise at least a day, 3 or more days a week? Yes     Comment: But not in last 5 month   ? Types of Exercise? (List in Comments) Yes     Comment: Running surfing   ? Do you follow a special diet? No   ? Vegan? No   ? Vegetarian? No   ? Pescatarian? No   ? Lactose Free? No   ? Gluten Free? No   ? Omnivore? No        Review of Systems:  Other than noted above, a full 14-point review of systems was reviewed and is negative for: General, Eyes, ENMT, respiratory, cardiovascular, GI, GU, musculoskeletal, allergy/immunology, endocrinology, hematology, skin, neurologic, and psychiatric.      Objective:      Last Recorded Vital Signs:    07/05/22 1502   BP: 121/75   Pulse: 62  Resp: 18   SpO2: 97%        General: The patient is a well-appearing, well-nourished, appears stated age, in no discomfort, cooperative.    HEENT:The head is of normal size and shape. The eyes appear normal with clear sclera and conjunctiva. The external auditory canals are normal with clear tympanic passages. The nasal septum and turbinates appear normal. The oropharynx is clear.    Neck: No carotid bruits. Supple musculature without muscle spasm.Cervical lateral flexion is 45/45 degrees towards the right and 45/45 degrees towards the left.Cervical rotation is 80/80 degrees to the right and 80/80 degrees to the left.     CV: RRR, no murmur or gallop. Normal S1, S2. No S3,S4.    Resp: Breathing unlabored. Clear to auscultation and percussion.No wheezes, rhonchi or rales    GI: Abd soft, NT/ND, Normal BS    Back/Extrem: No deformities,clubbing, cyanosis or edema. SLR negative. Full range of motion at the shoulders, elbows, wrists, hips, knees,& ankles.      Neurological Exam    Mental Status:   Level of consciousness: alert; oriented to person, place, time, situation. Physical appearance:groomed & appropriate.Mood:appropriate for situation.Attention:appropriate. Reality testing:no delusions or hallucinations.  Speech: spontaneous and fluent, intact comprehension, naming, repetition. Memory: normal immediate, short term & long term. Spells WORLD backwards. Recall 3/3 at 1 minute & 5-minutes.Normal digit repetition. Adequate fund of knowledge. Praxis and calculations intact. Adequate ability to interpret proverb abstractions.    Cranial Nerves:  I: Intact sense of smell to peanut, mint, paint thinner.  ZO:XWRUEA acuity J1/J1 (Snellen chart). Pupils 84mm./3mm. equal and reactive bilaterally. Visual fields are full to 2mm red pin. There is no ptosis.Fundoscopic exam reveals sharp discs with normal spontaneous venous pulsations and normal arteriovenous ratio.No papilledema, hemorrhages or exudates were seen.  III,IV,VI: Extraocular movements are normal in all directions without nystagmus.Oculokinetic nystagmus is normal.  V: Corneal reflex is present bilaterally. Facial sensation is normal to light touch, sharpness and cold temperature in all 3 divisions.Masseter strength is normal.  VWU:JWJXBJ strength of the orbicularis orus and oculi. Voluntary and involuntary smile are symmetric.  VIII: Hearing is intact to finger rubbing. Weber testing was midline. Air conduction> bone conduction.  IX,X: Normal gag reflex. Palate elevates in the midline. Swallowing appears normal.  XI: Normal sternocleidomastoid and trapezius strength and appearance bilaterally.  YNW:GNFAOZ tongue movement and strength bilaterally. No tongue atrophy or fasciculations.    Motor:   R L   Deltoid (axillary C5,6) 5 5   Biceps (MC C5,6) 5 5   Triceps (radial C6,7,8) 5 5   Wrist Extension/ECR (radial C6-8) 5 5   Wrist Flexion/FCR (median/ulnar) 5 5   Finger Extension (radial C7-8) 5 5   Finger Flexion (ulnar/median) 5 5   Thumb ABD/APB (median C8-T1) 5 5   Thumb ADD/ADP (ulnar C8-T1) 5 5   Interosseous (ulnar C8-T1) 5 5   Hip Extension (obt, I gluteal, sciatic, L2-S1) 5 5   Hip Flexion (obt, femoral L2,3,4) 5 5   Hip Abduction (S gluteal L5-S1) 5 5   Hip Adduction (obturator L2-L4) 5 5   Knee Extension (femoral L2,3,4) 5 5   Knee Flexion (sciatic L4-S1) 5 5   Dorsiflexion/ Tib Ant (D peroneal L4,5) 5 5   Plantar flexion (Tibial, S. peroneal L5- S2) 5 5 Eversion/ peroneus (S peroneal L5,S1) 5 5   Inversion/ Tib post (tibial, L5) 5 5        Deep Reflexes:  DTR:   R L  Biceps C5/6 2+ 2+   Triceps C6/7 2+ 2+   Brachioradialis C5/6 2+ 2+   Knee L3,4 2+ 2+   Ankle S1,2 2+ 2+   Plantar downgoing downgoing       Superficial Reflexes:  Downgoing toes bilaterally to plantar stimulation.No pathologic reflexes found, including facial, snout, glabellar and palmomental.    Cerebellar:  Romberg negative.Intact finger-to-nose, heel-to-shin, fine finger movements.Retropulsion and propulsion are negative. Rapid alternating movements normal.  Normal tandem gait.    Stance:   Normal narrow-based stance    Gait:  Normal ambulation with full arm swings. Normal toe & heel ambulation.    Sensory Exam:  Intact to light touch, pinprick, temperature in the extremities.    Normal position sense in the distal extremities. There is no vibratory delay in the distal extremities with a 124 cycle per second tuning fork.                                Lab Results   Component Value Date    WBC 6.22 06/04/2022    HGB 13.9 06/04/2022    HCT 41.1 06/04/2022    MCV 90.5 06/04/2022    PLT 264 06/04/2022     Lab Results   Component Value Date    CREAT 1.08 06/04/2022    BUN 17 06/04/2022    NA 137 06/04/2022    K 3.8 06/04/2022    CL 107 (H) 06/04/2022    CO2 19 (L) 06/04/2022     No results found for: ''HGBA1C''  Lab Results   Component Value Date    ALT 59 06/04/2022    AST 49 06/04/2022    ALKPHOS 68 06/04/2022    BILITOT 0.2 06/04/2022     Lab Results   Component Value Date    TSH 0.85 06/06/2022     Lab Results   Component Value Date    CALCIUM 8.9 06/04/2022    PHOS 3.9 06/04/2022     Lab Results   Component Value Date    CHOL 169 06/06/2022    CHOLHDL 58 06/06/2022    CHOLDLCAL 82 06/06/2022    TRIGLY 147 06/06/2022            Assessment:      Stiven Manfredi?is a 61 y.o.?male?with no formal psychiatric history (possible undiagnosed depression in his teens in the setting of mother passing from lung cancer, no hospitalizations, no suicide attempts) who presents due to worsening ruminative anxiety, multiple somatic symptoms, chronic insomnia leading to passive SI and hopelessness; consult for discussion of multiple neurological symptoms.    During our interview, patient continuously was fixated on possibility of a degenerative somatic medical condition which could explain his muscle atrophy (which I explained multiple times is secondary to deconditioning and disuse given his debilitating anxiety). He describes being in a ''fight or flight'' response for the past 7 weeks and is endorsing extreme levels of anxiety, guilt , and depression. Thus far, given the level of work up he has completed he has completed during various trips to the hospital (including MR imaging of spine and brain), there is no indication to suggest an organic or neurological cause for his symptoms. He previously felt that he had MS and had extensive work up (including an LP) which was negative.     I explained that his symptoms are likely psychosomatic and/or reflective of illness anxiety disorder and that he cannot expect to get  better or return to his prior level of functioning without addressing his mental issues first. I explained over and over that he has no medical concern thus far to indicate an inability to regrow his muscles-I explained that his muscle atrophy is secondary to lack of exercise and deconditioning, He continued to perseverate and show me his legs to try and emphasize his point.    I recommended that he continuing following the advice of his psychiatrist and other mental health professionals. Given that he seems fixated on muscle atrophy and deconditioning, we will perform a EMG/NCS to again rule out any other causes of atrophy and to provide the patient with even more evidence to suggest no neurodegenerative process.    Follow up, 07/05/22:  Patient is presenting for an inpatient evaluation. Physical examination was normal aside from a benign tremor involving his right hand when anti-gravity and may reflect either an essential tremor or a drug induced tremor. I did not appreciate any focal atrophy, fasciculations, or weakness to suggest an underlying neuromuscular pathology. I do not believe an EMG/NCS is warranted at this time. For now, I provided reassurance that he has no neurological issues and that he is encouraged to continue following the advice of his mental health professionals. I will be happy to follow up in the future on an as needed basis if he develops any neurological deficits.    1. Anxiety    2. Erectile dysfunction, unspecified erectile dysfunction type    3. Essential hypertension    4. Depression, unspecified depression type    5. Somatoform disorder      Plan/ Recommendation:        No follow-ups on file.    The above recommendation were discussed with the patient.  The patient had all questions answered satisfactorily and is in agreement with this recommended     Thank you for asking me to participate in the care of this patient.    Total minutes spent today including review of tests/notes, obtaining history, performing exam, counseling/educating patient/family, ordering tests/medications, communicating with other physicians, charting, independently interpreting results and communicating test results to patient/family = 30 minutes     Author: Lacretia Nicks 07/05/2022 4:11 PM               Department of Neurology      [facility]      [email or web address]

## 2022-07-26 ENCOUNTER — Telehealth: Payer: BLUE CROSS/BLUE SHIELD

## 2022-07-27 NOTE — Telephone Encounter
Hi Dr. Alberteen Spindle see message below and advise.

## 2022-07-27 NOTE — Telephone Encounter
Call Back Request      Reason for call back: Sevan w/ [employer] called in requesting to speak with the dr in regards to the pt's recent hospitalization. Please contact.    Any Symptoms:  []  Yes  [x]  No      If yes, what symptoms are you experiencing:    Duration of symptoms (how long):    Have you taken medication for symptoms (OTC or Rx):      If call was taken outside of clinic hours:    [] Patient or caller has been notified that this message was sent outside of normal clinic hours.     [] Patient or caller has been warm transferred to the physician's answering service. If applicable, patient or caller informed to please call back if symptoms progress.  Patient or caller has been notified of the turnaround time of 1-2 business day(s).

## 2022-07-31 ENCOUNTER — Inpatient Hospital Stay
Admit: 2022-07-31 | Discharge: 2022-08-01 | Disposition: A | Discharge status: Home / Self Care | Payer: BLUE CROSS/BLUE SHIELD | Source: Home / Self Care | Attending: Emergency Medical Services

## 2022-07-31 DIAGNOSIS — F332 Major depressive disorder, recurrent severe without psychotic features: Secondary | ICD-10-CM

## 2022-07-31 DIAGNOSIS — F99 Mental disorder, not otherwise specified: Secondary | ICD-10-CM

## 2022-07-31 DIAGNOSIS — M25561 Pain in right knee: Secondary | ICD-10-CM

## 2022-07-31 DIAGNOSIS — G8929 Other chronic pain: Secondary | ICD-10-CM

## 2022-07-31 NOTE — ED Notes
Psych Eval at bedside now

## 2022-07-31 NOTE — Consults
Psychiatric ED Consultation Note 07/31/2022   Patient Name: Tyler Mahoney   Patient MRN: [id number]   Date of Birth: Mar 10, 1961   Patient Location: H07/H07     Patient Assessment:  This visit was conducted in-person with the patient.    Requesting Physician:  Oman, Amarrion V., MD    Identifying Data:  Tyler Mahoney is a 61 y.o. 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    Chief Complaint: ''I thought about driving to [facility] to jump off a cliff''    Collateral Contact Information:  No emergency contact information on file.  Outpatient Psychiatrist:  Dr. Alvino Chapel , NP   -Last met 12/8, next appointment 12/15   Outpatient Therapist: none    History of Present Illness:  Encounter completed with patient's brother at bedside, per patient's preference.    He tried to [employer] yesterday, as he has for decades, and he was unable to because his legs were too 'shaky.' This made him feel hopeless, but he went home and had a nice evening with his wife. He then woke up this morning and felt depressed and anxious and thought to drive to [facility] and jump from a cliff. He did not grab the car keys or sit in the car. He did not look into where exactly he could jump from using internet searches. He did not write a letter or say goodbye to anyone. He communicated with his family, who out of concern, asked the patient to come to the emergency room to seek voluntary hospitalization. He has not stockpiled any drugs or medications to overdose on; he has no weapons or guns at home. He feels hopeless because each day is 'worse than the last.' He has an increased appetite and feels surrounded by negative self-rumination. His 'thoughts are foggy.' He feels guilty, as though what he is suffering is his fault (mentioning here that he used a supplement for years that he abruptly stopped using in the last year; also documented previous to his recent hospitalization at [facility]). He feels like he is 'shrinking,' particularly through his legs growing shorter in length. Per his brother, his legs have objectively preserved and symmetric strength, as they do the same workouts at the gym. And, spinal imaging has not revealed any central nervous concerns that would explain 'lower extremity weakness.'       Psychiatric History (copied forward from discharge summary from 06/05/2022, with edits as appropriate):  - Psychiatric diagnoses: depression, anxiety, r/o somatic symptom disorder, r/o illness anxiety, r/o psychosis  - Psychiatric hospitalizations:     -[facility], 10/15-10/18/23, voluntary  - Suicide attempts: No history of suicide attempts.  - Self-injurious behavior: No history of self-injurious behavior.  - Violent behavior: No history of violent behavior.  - Engagement in outpatient psychiatric care:   -06/2022, RTC, [facility], 4 weeks: no medication changes, feels as though his depression and anxiety both improved while he was there; they considered ketamine though his insurance did not cover it, and so they discussed ECT though Tyler Mahoney was hesitant    -05/2022, IOP, [facility], 2 weeks: after discharge from [facility], 'hated it,' feels like he 'got worse,' in part because of their groups  -Seeing Tyler Riddle, NP  - Psychiatric medication trials:    -duloxetine, bupropion (worsened anxiety), Lunesta, quetiapine, trazodone, clonazepam  -currently, sertraline, olanzapine, lorazepam, gabapentin    Substance  Abuse History:  none    Alcohol Screen:  No or Low Risk: The patient was screened with a validated tool, and the score on the alcohol screen indicates no or low risk of alcohol related problems.    Tobacco Screen:  Tobacco use status unknown.    Social History:  Lives with wife Tyler Mahoney. Two adult children. Runner and surfer for decades, and has had difficulty adjusting to a life without these sports over the last year in the setting of MSK problems. Middle school Editor, commissioning. Connected to his brothers. He tried to return to work after [employer], and this did not go well, so he was checked into residential treatment at the [facility].    Family Psychiatric History:  -Mother: possible Bipolar Disorder, EtOH use disorder (multiple hospitalizations in patient's youth leading to father getting custody)   -Brother: OCD, EtOH use disorder     Developmental and Educational History:  Not assessed due to patient's adult age and current clinical presentation.    Assets and Strengths:   Support of family/friends/partner (brothers)    Disabilities and Liabilities:  Poor understanding of illness (-)    Past Medical History:  HTN  HLD    Allergies and Adverse Drug Reactions:  No Known Allergies    Medication Reconciliation:  -Olanzapine 15mg  PO at bedtime (recently decreased from 20mg  PO at bedtime on 07/29/22)  -sertraline 100mg  PO every day (taking for at least 6 weeks at this dose)  -lorazepam 1-2mg  PO every day (had been tapered down to 0.5mg  PO TDD, though has recently began taking more to cope with anxiety, panic)  -gabapentin 200mg  PO TID  -losartan, atorvastatin?    Review of Systems:  Review of systems not performed due to plan for patient to discharge from ED.    Physical Examination:  Vitals:    07/31/22 1210 07/31/22 1211   BP: 111/70    Pulse: 63    Resp: 18    Temp: 36.2 ?C (97.2 ?F)    TempSrc: Temporal    SpO2: 99%    Weight:  130 lb (59 kg)   Height:  5' 4'' (1.626 m)        Physical exam not performed due to plan for patient to discharge from ED.    Cranial Nerve Examination:  Cranial nerve exam not performed due to plan for patient to discharge from ED.    Laboratory Data and Studies:  No results found for this or any previous visit (from the past 72 hour(s)).    Mental Status Examination:   Appearance: 61 y.o. male. In moderate distress, well groomed.  Behavior: Cooperative.   Motor: No psychomotor agitation or retardation. No tremor. No evidence of extrapyramidal signs. Gait was not assessed.  Speech: Normoverbal. Non-pressured.   Mood: ''depressed''  Affect: Affect was constricted, reactive, and well-related.  Thought process: Linear and logical.  Thought content: Notable for no suicidal ideation and no homicidal ideation. Query somatic delusions.  Cognition: Oriented to person, place, time, and situation. Intellectual functioning is average as evidenced by fund of knowledge. Memory is grossly intact as evidenced by ability to engage in an interview.   Insight: Partial insight as evidenced by ability to verbalize an understanding of the symptoms of his illness, its implications, and the need for treatment.   Judgment: Fair judgment as evidenced by help-seeking behavior.     Suicide Risk Assessment:  Risk and protective factors:  1. Suicidal ideation:     SI with  a detailed plan: jump from a cliff in [facility]   2. Intent to act upon thoughts of suicide:     Denies intent   3. Firearms:     No access to a firearm   4. Access to other stated means and environmental factors:     Has means to cut or stab self  Has means or access to inflict physical trauma   5. Recent suicidal behaviors or preparatory acts:     Contemplating or researching methods   6. Prior suicide attempts:     None   7. Self-directed violence without suicidal intent:     None   8. Psychiatric and family history:    See psychiatric history for details on diagnoses and hospitalizations   9. Other key symptoms and dynamic risk factors:     Severe symptoms of: Hopelessness, Anxiety or panic   10. Protective factors:    (Absences of risk factors are also considered proctective factors)  Support from family, friends, or others, specifically brothers  Responsibility to family or others  Engagement in work, school, or other  Compliance with treatment   Help seeking behaviors  Fear of death or dying  Identified reasons for living     Risk formulation and interventions:  11. Baseline chronic risk   Compared to the general population, the patient's baseline chronic suicide risk is estimated to be:    Moderately elevated baseline risk   12. Acute risk  The patient's current, acute risk is estimated to be:    Mildly elevated above his baseline   13. Risk mitigation and interventions:    Patient engaged in his safety and treatment planning  Appropriate treatment has been initiated or ongoing  Developed a plan to manage stressors  Steps were taken to remove or reduce means of self-harm  Positive rapport established with provider  Resources were mobilized: Support from family, friends, or other  Thoughts of suicide have decreased  Patient has identified reasons for living  Acute crisis has decreased   14. Additional considerations:    None   15. The most appropriate and least restrictive treatment recommendations at this time are:    Outpatient care, including: Psychiatric care, Psychotherapy, Return precautions for psychiatric urgent care or emergency care, Referral to crisis follow-up services  Partial hospitalization or intensive outpatient program     Diagnostic Impression:  Mental Health Diagnoses and Relevant Medical Conditions:  MDD, severe, without psychotic features, with anxious distress  R/o MDD, severe, with psychotic features  R/o somatic symptom disorder  R/o illness anxiety    Significant Psychosocial and Contextual Factors:  No prior suicide attempts or self-harm    Assessment and Plan:  Tyler Mahoney is a 61 y.o. 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    Tyler Mahoney presents dysthymic, linear, and engaged in care. He reports a burden of distress that was not usually palpable on encounter, and the encounter itself coincided with improvement in future-orientation; he expressed no suicidal ideation during our encounter. We discussed the various possibilities given the absence of inpatient beds at [facility]. He and his brother agreed to safety plan to continue outpatient management. His brother will help his wife guard destructive and lethal means, including car keys, ingestions, ligatures, and sharps. He will contact his brother should suicidal ideation return. He agrees to [employer] follow-up, and a  voicemail with his cellphone number was left. He agrees to return to the emergency room should he feel unsafe outside the hospital. He will follow-up with his psychiatric nurse practitioner on Friday. The emergency room will provide a small supply of lorazepam 1mg  to take 1-2mg  TDD as needed for panic. He will increase sertraline to 150mg  PO every day. He and his brother will follow up with [facility] E[facility] to schedule an evaluation as soon as possible. He will continue to search for a psychotherapist and a higher level of care, if needed, taking time off work, if needed.     - Inpatient psychiatric admission not planned at this time   - Anticipated discharge disposition: Home or self-care  - The patient was counseled regarding treatment plan and follow-up.  - The patient was strongly counseled to return to the [facility] ED or nearest emergency department (1) should patient's symptoms persist or worsen, (2) should there be any concern for his safety or the safety of others, or (3) should he no longer be able to care for his basic needs or avail himself of adequate assistance.  - Please page [phone] for further questions or concerns. Thank you for involving [facility] in this case.    Note written by Minoru Chap S. Celine Mans, psychiatry resident. This patient was discussed with Dr. Conrad Burlington, attending psychiatrist, with whom the above assessment and plan were jointly formulated. Recommendations and plan were discussed with referring treatment team.

## 2022-07-31 NOTE — ED Provider Notes
Tyler Mahoney [facility]  Emergency Department Service Report    Tyler Mahoney, a 61 y.o. male, presents with Suicidal (Reports SI denies recent attempts, denies HI or AVH.)      Triage   Arrived at 12:08 PM  Arrived by Walk-in [[id number]]    ED Triage Vitals   Temp Temp Source BP Heart Rate Resp SpO2 O2 Device Pain Score Weight   07/31/22 1210 07/31/22 1210 07/31/22 1210 07/31/22 1210 07/31/22 1210 07/31/22 1210 07/31/22 1255 07/31/22 1211 07/31/22 1211   36.2 ?C (97.2 ?F) Temporal 111/70 63 18 99 % None (Room air) Three 59 kg (130 lb)       Chief Complaint   Patient presents with    Suicidal     Reports SI denies recent attempts, denies HI or AVH.        Comprehensive Exam Initiated  Contact Date: 07/31/22  Contact Time: 1326    History     Tyler Mahoney, a 61 y.o. male with PMH below, presents with    Pertinent family history: None  Records reviewed: Triage note and vitals      Additional past medical history:    Past Medical History:   Diagnosis Date    Anxiety 05/04/2022    Depression 05/04/2022    Hyperlipidemia 03/20/2012    Thats an estimate    Hypertension 03/20/2012    Estimate    Memory problem 05/04/2032        Past Surgical History:   Procedure Laterality Date    VASECTOMY  08/26/96    Estimate        ROS:  A 10 point review of systems was performed and was negative except as documented in the above HPI      Physical Exam:    Vital signs reviewed  General:  Well-appearing in *** distress   HEENT:  Normocephalic, atraumatic.  Eyes:  Extraocular eye movements grossly intact, no scleral icterus.  Cardiovascular:  Strong radial pulse, regular rate. ***  Respiratory:  Normal and nonlabored breathing. ***  Abdomen:  Nondistended. ***  Musculoskeletal:  No deformities or edema.  Neurological: Moving all 4 extremities with no apparent focal neurologic deficits.  Normal gait.  Psychiatric:  Normal mood and affect, interacting appropriately with staff.  Skin:  No apparent rashes or erythema.      MDM:  [name], a 61 y.o. male with PMH noted above presenting to the ED with     Additional MDM:  Review of external records: reviewed ***  Discussion with independent historian: ***  Chronic conditions affecting care: As above  Discussion with other healthcare professional: ***  Social determinants of health: ***      Progress Notes        Laboratory Results     Labs Reviewed - No data to display     Imaging Results     No orders to display       Any ED procedures performed are documented on separate ED procedure notes.    Clinical Impression   No diagnosis found.    Disposition and Follow-up   Disposition:  ***     No future appointments.    Follow up with:  No follow-up provider specified.    Return precautions are specified on After Visit Summary.    New Prescriptions    No medications on file       Orders Placed This Encounter    Legal status emergency hold  Resident Signature     Otilio Miu, MD  PGY-3 Emergency Medicine  07/31/2022 1:26 PM

## 2022-08-01 MED ORDER — LORAZEPAM 1 MG PO TABS
1 mg | ORAL_TABLET | Freq: Two times a day (BID) | ORAL | 0 refills | Status: AC | PRN
Start: 2022-08-01 — End: ?

## 2022-08-01 MED ORDER — SERTRALINE HCL 100 MG PO TABS
150 mg | ORAL_TABLET | Freq: Every day | ORAL | 0 refills | Status: AC
Start: 2022-08-01 — End: ?

## 2022-08-01 NOTE — Discharge Instructions
Emergency Department Discharge Instructions      Summary of your visit  You have been evaluated in the [facility] Emergency Department today for your psychiatric complaint.  You were evaluated by both Emergency Medicine and Psychiatry staff and have been cleared to go home.    Follow-Up  Please follow up with your psychiatrist within 2-3 days. Below, you can find a list of community psychiatric resources you can use.  If you are uninsured, please call 2-1-1 to find a free or low-cost clinic in your area. 2-1-1 [place] is the central source for providing information and referrals for all health and human services in [facility] [place]. Our 2-1-1 phone line is open 24 hours, 7 days a week, with trained [employer] prepared to offer help with any situation, any time. Our community services go far beyond phone referrals - explore our website to learn more. If you are calling from outside [facility] or cannot directly dial 2-1-1, you can call [phone].      Return to the Emergency Department if you experience:  Suicidal thoughts, a plan to harm yourself, and the means to do so   Serious thoughts of hurting someone else  Hearing voices that others do not hear  Seeing things that others do not see  Being unable to care for yourself   Any other concerning symptoms     Thank you for choosing [place] for your care. It was a pleasure taking part in your care today, and we wish you the best!     COMMUNITY PSYCHIATRY REFERRALS    Suicide Hotline: (715)379-9388 or 800-273-TALK (581) 688-8805) or [phone] (Spanish)    [facility] Psychiatric Emergency Team/PMRT:  [phone] or [phone]    [facility] SMART Team:   [phone]    [place] [facility]: [phone]. [place] has many psychiatric outpatient clinics.     Call your insurance for a list of psychiatrists and psychotherapists and outpatient programs    MENTAL HEALTH SERVICES              Hours or services may vary due to COVID-19  [facility]..?????????????????????..?.??..[phone]  [address] [place] [facility] [address] * Open 24 hours * Walk-ins welcome    [facility]?????????.?????????.??.........?..[phone]  [address] [place]. [place]  [address] - 4th Floor  * Mon - Fri 8a - 5p    [facility]??????????.????...?..?..?..[phone]  [address] [place], [place] [address] * Sheral Flow - Fri. 8:30a - 5p    [place] Dept of Mental Health Hotline (24/7)??????..???????(800) 5487355394    [facility] (serves ages 15-59)??..????.????????..?..[phone]  [address] [place] [address] * [phone] - Fri, 9:30a - 11a    [facility]?????????????????????????...??..[phone]  [address] [place] [address] or HPAC, [address]????????..?..[phone]    [facility]?.????...?....?..[phone]  [address] [place], [place] [address] * Sheral Flow - Thurs 8a - 8p & Fri. 8a - 5p    [facility] Self-help?????????????..???????..??????..[phone]  Offers a range of peer-led self-help support groups. At two locations: [facility]   Call for more information [phone] - Sun 7a - 930p    [facility]:   [facility]: [phone], [address] [place]: [phone], [phone] [facility]  Open 24 hours per day, 365 days per year, on a walk-in basis  individuals in crisis can  be assessed for stabilization services, medication refills/ evaluation and management, or hospitalization if necessary.      [facility] Clinics: must present to the clinic designated for service area. Call 24/7 Access Line: [phone] for clinic locations, hours, etc.   [facility]: [phone], [address] [place]alie Gums [address]  [place]: [phone], [address] [facility], [place] [address]  Jearld Lesch: [phone], [address] [place], [address]  [place]: [phone], [address], [place], [place] [address]  Hollice EspyGriffith Citron [phone], [address].   [place]: [phone], [address] [place] [address]  Laurin Coder: [phone], [address] [phone], [address] [place] [address]  [place]: [phone], [address]  [place]: [phone], [address] [place] [address]  [place]: [phone], [address], [place], [place], [address]  [place]: [phone], [address], [place] [address]  [place]: [phone], [address] [place] [address]  [place]: [phone], [address], [place] [address]  [place]: [phone], [address] [place], [address]  [place]: [phone], [address], [place] [address]  [place]: [phone], [address] [place], [place] [address]  [place]: [facility] [phone], [address].  [facility]: [phone], [address], [place] [address]  Ruben ImCharlott Holler [phone], [address] [place], [place], [place] [address]  [place]: [phone], [address], [place] [address]  [facility]: [phone]  Walk-in appointments:  [address] 10-11:30 M, W-F or 2-3:30 M, T, F  [address] [facility]. 10-11:30 M/F or 2-3:30 T, W, F  [address] (teens only) 2-5 M-W  [address]. Th 3:30-4:30      Other County Mental Health Clinics:  [facility] Mental Health: [phone]  [facility] Mental Health: [phone]  [facility] Mental Health [phone]  [facility] Mental Health [phone]  [facility] [phone]  [facility] Mental [facility] [phone]    Spanish Language Clinics  [facility] - [phone] -contact [facility]: [phone] - therapy, case management and medication/psychiatric support in [facility]     Asian Language Clinics  [facility] [phone]  [facility] [phone]    [facility] Speaking Community Psychiatrists    Dr. Jalene Mullet [phone] - no insurance  Dr. Judie Grieve [phone] - no insurance    [facility]        [phone] [address] [place], [place]   Counseling offered on a sliding fee scale basis. Support groups, HIV care, anger management, case management and other services also available.    Homeless Shelters  [facility] [phone] inform of available shelters in local areas  [facility] [phone]  [place] [phone]    Homeless Drop-In Centers:  [facility] Access [facility]: [address] [place] [phone]  Drop-in center for clothing, showers, sack lunches, and referrals to shelters  [place] [address], [place] [phone]   Emergency services, including shower, laundry, mail, clothing, counseling, and case management. All services provided free of charge. Orientation 8am M-F.  [facility] [address] [place], [address] [place] [phone]  Psychotherapy, case management, drop-in center, and other services.  [facility]) [address], [place], [place] [phone]  Wide ranging outpatient services--therapy, psychiatry, groups, case management, referrals. All qualify for services.  [facility] [address], [place] [phone]  [facility] [address], [place]  [phone]   Walk-in drop-in center. Assistance with housing, groups, therapy, psychiatry. Meals  [facility]  [address], [place], [phone]   Outreach, case management and housing placement services. laundry, showers. No fee.      Domestic Violence Shelters  Domestic Violence Drop-In and [employer] [phone]  [facility] Service DV Program [phone], [facility] [address] [facility] [employer] [phone], [facility] (women and children) [phone]  [facility] [phone] [place], [facility]   [facility] [phone], [address]  Renita Papa [phone], [facility] [phone], [facility] of Windell [place] [phone], [place] [phone], [facility]    Rape Treatment Center  [facility]  [address], [place], [place] [address]  [phone]    Financial Assistance:  [place] [address] Department of Public Social Services (General Relief, [place], Medi-Cal etc.) [phone]  Local Office: [address] [place], [place] [phone]  Medi-Cal [phone]  Social Security Administration (SSI, [place], SSDI) - 24-hour Info Line [phone]   Local Office: [address] [place], [address], [place] [address]

## 2022-08-01 NOTE — ED Notes
Patient provided with d/c instructions and belongings. No distress noted at time of d/c. Brother at bedside during d/c.

## 2022-08-12 ENCOUNTER — Ambulatory Visit: Payer: BLUE CROSS/BLUE SHIELD | Attending: Neurology

## 2022-10-03 ENCOUNTER — Telehealth: Payer: BLUE CROSS/BLUE SHIELD

## 2022-10-03 NOTE — Telephone Encounter
Call Back Request      Reason for call back: Please see mychart message on 04/19/22. Pt has recent images in CC. He would like to schedule an appt with Dr. Zenaida Deed. Please advise.     Any Symptoms:  []$  Yes  [x]$  No      If yes, what symptoms are you experiencing:    Duration of symptoms (how long):    Have you taken medication for symptoms (OTC or Rx):      If call was taken outside of clinic hours:    []$ Patient or caller has been notified that this message was sent outside of normal clinic hours.     []$ Patient or caller has been warm transferred to the physician's answering service. If applicable, patient or caller informed to please call us back if symptoms progress.  Patient or caller has been notified of the turnaround time of 1-2 business day(s).   Thank you

## 2022-11-09 ENCOUNTER — Ambulatory Visit: Payer: BLUE CROSS/BLUE SHIELD | Attending: Pain Medicine

## 2023-01-03 ENCOUNTER — Ambulatory Visit: Payer: BLUE CROSS/BLUE SHIELD

## 2023-01-18 ENCOUNTER — Inpatient Hospital Stay: Payer: BLUE CROSS/BLUE SHIELD

## 2023-03-13 NOTE — Progress Notes
ENDOCRINOLOGY CLINIC NOTE    PATIENT: Tyler Mahoney MRN: [id number]   DOB: 30-May-1961  DATE OF SERVICE: 03/14/2023    REFERRING PRACTITIONER: Referral, Self PRIMARY CARE PROVIDER: Naida Sleight, MD    CHIEF COMPLAINT:   Chief Complaint   Patient presents with    Fatigue     Hormones check      Problem:     Tyler Mahoney is a 62 y.o. male who presents for consultation regarding the following endocrine issue:    Depressed for many months after he injured himself from running.  Feels like he is losing muscle mass.  Hand shakiness has been off an on for a few months.    He is working out with a Insurance account manager a few times a week.    He recently had labs checked on 01/2023 at 5PM that showed cortisol 10, TSH 1.35, FT4 1.5    (click to expand/collapse)    ROS:   Additional ROS complete and negative unless reported otherwise above.  Past Medical History:     Past Medical History:   Diagnosis Date    Anxiety 05/04/2022    Depression 05/04/2022    Hyperlipidemia 03/20/2012    Thats an estimate    Hypertension 03/20/2012    Estimate    Memory problem 05/04/2032      Past Surgical History:     Past Surgical History:   Procedure Laterality Date    VASECTOMY  08/26/96    Estimate      Medications:     Medications that the patient states to be currently taking   Medication Sig    atorvastatin 40 mg tablet 1 tablet (40 mg total).    LORazepam (ATIVAN) 1 mg tablet Take 1 tablet (1 mg total) by mouth two (2) times daily as needed. Max Daily Amount: 2 mg    losartan 100 mg tablet Take 1 tablet (100 mg total) by mouth daily.      Allergies:   No Known Allergies      (click to expand/collapse)    Objective:     Vitals: BP 146/84  ~ Pulse 63  ~ Temp 36.9 ?C (98.4 ?F) (Forehead)  ~ Ht 5' 4'' (1.626 m)  ~ Wt 130 lb (59 kg)  ~ SpO2 99%  ~ BMI 22.31 kg/m?      Wt Readings from Last 3 Encounters:   03/14/23 130 lb (59 kg)   07/31/22 130 lb (59 kg)   07/05/22 134 lb 9.6 oz (61.1 kg)     Body mass index is 22.31 kg/m?Marland Kitchen    GEN: NAD, alert, appears stated age  Ears/nose/throat: Mucous membranes moist.   Neck: No apparent thyromegaly or lymphadenopathy.   Respiratory: normal respiratory rate and chest wall excursion.   Musculoskeletal: extremities without obvious pitting edema.   Neurologic: Patient is alert and oriented x3 and there is no focal strength deficit.   Skin: no rashes, cuts, sores.     Labs:      HbA1c    No results found for: ''HGBA1C'' No results for input(s): ''GLUCOSE'' in the last 72 hours. No results for input(s): ''GLUCOSEPOC'' in the last 72 hours.  BMP    Lab Results   Component Value Date    NA 137 06/04/2022    K 3.8 06/04/2022    CL 107 (H) 06/04/2022    CO2 19 (L) 06/04/2022    BUN 17 06/04/2022    CREAT 1.08 06/04/2022    GLUCOSE 144 (H) 06/04/2022  CBC    Lab Results   Component Value Date    WBC 6.22 06/04/2022    HGB 13.9 06/04/2022    HCT 41.1 06/04/2022    MCV 90.5 06/04/2022    PLT 264 06/04/2022      Endocrine    Lab Results   Component Value Date    TSH 0.85 06/06/2022    LFT    Lab Results   Component Value Date    ALT 59 06/04/2022    AST 49 06/04/2022    ALKPHOS 68 06/04/2022    BILITOT 0.2 06/04/2022    Creatinine     Lab Results   Component Value Date    CREAT 1.08 06/04/2022     No results found for: ''GFR'', ''GFREST'', ''GFRESTNOAA'', ''GFRESTAA''   Ca/ Mg/ Phos/Zinc/Copper    Lab Results   Component Value Date    CALCIUM 8.9 06/04/2022    PHOS 3.9 06/04/2022    MG 1.7 06/04/2022     PTH / Vitamin D    Lab Results   Component Value Date    VITD25OH 42 06/06/2022    Lipids    Lab Results   Component Value Date    CHOL 169 06/06/2022    TRIGLY 147 06/06/2022    NOHDLCHOCAL 111 06/06/2022        Imaging:   Endocrine Imaging: US Thyroid:    and DOTATATE PET/CT:       Pathology:          Assessment & Plan/Recommendations:     #Fatigue and muscle weakness  -check for hypogonadism with FSH, LH, PRL and Free Testosterone level  -check for adrenal insufficiency with ACTH and cortisol level  -check for insulin resistance with insulin, c-peptide and CMP    (click to expand/collapse)    PROBLEMS REVIEWED   []  1+ chronic illness with exacerbation/progression/side effects of treatment  []  2 stable chronic illnesses  [x]  1 undiagnosed new problem with uncertain prognosis  []  1 acute illness with systemic symptoms    ICD-10-CM    1. Other fatigue  R53.83 CBC & Auto Differential     Comprehensive Metabolic Panel     Insulin     C-Peptide     FSH     LH     Testosterone, Bioavailable and Total, Includes Sex Hormone-Binding Globulin (Adult Females, Children, or Individuals on Testosterone-Suppressing Hormone Therapy) (Sendout)     Prolactin     ACTH     Cortisol      2. Muscle weakness  M62.81 CBC & Auto Differential     Comprehensive Metabolic Panel     Insulin     C-Peptide     FSH     LH     Testosterone, Bioavailable and Total, Includes Sex Hormone-Binding Globulin (Adult Females, Children, or Individuals on Testosterone-Suppressing Hormone Therapy) (Sendout)     Prolactin     ACTH     Cortisol        REVIEW OF DATA    I have:   Reviewed []  1 []  2 [x]  ? 3 unique laboratory, radiology, and/or diagnostic tests   Reviewed [x]  1 []  2 []  ? 3 prior external notes and incorporated into patient assessment  Ordered    []  1 []  2 [x]  ? 3 unique laboratory, radiology, and/or diagnostic tests    []  1 Obtained assessment from an independent historian    []  Discussed management or test interpretation with external provider(s)   RISK OF COMPLICATION  This Clinical research associate has deemed the above diagnoses to have a risk of complication, morbidity or mortality of:   []  Minimal  []  Low  [x]  Moderate (prescription drug, decision regarding surgery, social determinants)  []  Severe (decision regarding hospitalization)       The above recommendation were discussed with the patient. The patient has all questions answered satisfactorily and is in agreement with this recommended plan of care.    Return if labs show hormonal abnormalities.      Author:  Glenice Bow 03/14/2023 2:14 PM

## 2023-03-14 ENCOUNTER — Ambulatory Visit: Payer: BLUE CROSS/BLUE SHIELD

## 2023-03-14 DIAGNOSIS — R5383 Other fatigue: Secondary | ICD-10-CM

## 2023-03-14 DIAGNOSIS — M6281 Muscle weakness (generalized): Secondary | ICD-10-CM

## 2023-03-14 NOTE — Patient Instructions
Please feel free to use the MyChart message system to communicate quick questions, provide updates, request refills or labs, but anything more than 1-2 sentences usually suggests that a visit is needed to fully evaluate the issue and also to make sure I have the time reserved to devote adequate attention.  For any question that is more than very brief, please feel free to contact the office or use the MyChart system to schedule a visit.  For any urgent concerns please contact the office directly at [phone].      Please confirm we have your preferred laboratory for blood draws and pharmacy for prescriptions on file.  If you use multiple pharmacies, when requesting medications please confirm which pharmacy you would prefer for that particular medication to go to.    If you have labs ordered, please get them done 1-2 weeks prior to our scheduled follow up.   If the labs are done at [facility] lab, I will be able to comment on them through MyChart so I encourage you to make sure that you are signed up for our portal.  If the labs are done at an outside facility, the results may be delayed to our medical record system. Please let us know when you get your tests done at the external facility so that we can get the results in a timely manner to provide you the best care possible.    -----------------------------------------------      Please get blood tests done in the morning fasting

## 2023-03-15 ENCOUNTER — Ambulatory Visit: Payer: BLUE CROSS/BLUE SHIELD

## 2023-03-16 ENCOUNTER — Ambulatory Visit: Payer: BLUE CROSS/BLUE SHIELD

## 2023-03-17 DIAGNOSIS — R7989 Other specified abnormal findings of blood chemistry: Secondary | ICD-10-CM

## 2023-03-17 MED ORDER — DEXAMETHASONE 1 MG PO TABS
1 mg | ORAL_TABLET | Freq: Once | ORAL | 0 refills | 3.00000 days | Status: AC
Start: 2023-03-17 — End: ?

## 2023-03-20 ENCOUNTER — Ambulatory Visit: Payer: BLUE CROSS/BLUE SHIELD

## 2023-03-21 DIAGNOSIS — R7989 Other specified abnormal findings of blood chemistry: Secondary | ICD-10-CM

## 2023-03-21 MED ORDER — DEXAMETHASONE 1 MG PO TABS
1 mg | ORAL_TABLET | Freq: Once | ORAL | 0 refills | Status: AC
Start: 2023-03-21 — End: ?

## 2023-03-26 ENCOUNTER — Ambulatory Visit: Payer: BLUE CROSS/BLUE SHIELD

## 2023-03-27 ENCOUNTER — Ambulatory Visit: Payer: BLUE CROSS/BLUE SHIELD

## 2023-03-30 ENCOUNTER — Ambulatory Visit: Payer: BLUE CROSS/BLUE SHIELD

## 2023-04-04 ENCOUNTER — Ambulatory Visit: Payer: BLUE CROSS/BLUE SHIELD

## 2023-04-10 ENCOUNTER — Ambulatory Visit: Payer: BLUE CROSS/BLUE SHIELD

## 2023-04-10 DIAGNOSIS — R7989 Other specified abnormal findings of blood chemistry: Secondary | ICD-10-CM

## 2023-04-10 MED ORDER — DEXAMETHASONE 0.5 MG PO TABS
.5 mg | ORAL_TABLET | Freq: Four times a day (QID) | ORAL | 0 refills | 3.00000 days | Status: AC
Start: 2023-04-10 — End: 2023-04-14

## 2023-04-12 ENCOUNTER — Telehealth: Payer: BLUE CROSS/BLUE SHIELD

## 2023-04-12 ENCOUNTER — Ambulatory Visit: Payer: BLUE CROSS/BLUE SHIELD

## 2023-04-12 NOTE — Telephone Encounter
Call Back Request      Reason for call back: Patient following up on medication request asking it be done urgently.    Any Symptoms:  []  Yes  [x]  No      If yes, what symptoms are you experiencing:    Duration of symptoms (how long):    Have you taken medication for symptoms (OTC or Rx):      If call was taken outside of clinic hours:    [] Patient or caller has been notified that this message was sent outside of normal clinic hours.     [] Patient or caller has been warm transferred to the physician's answering service. If applicable, patient or caller informed to please call us back if symptoms progress.  Patient or caller has been notified of the turnaround time of 1-2 business day(s).

## 2023-04-12 NOTE — Telephone Encounter
New Rx Request      Last seen by MD: 03/14/23    Reason for the request: Pt states that he accidentally threw away his medication dexAMETHasone 0.5 mg tablet and is requesting for another Rx.    Any Symptoms:  []  Yes  [x]  No      If yes, what symptoms are you experiencing:    Duration of symptoms (how long):    Have you taken medication for symptoms (OTC or Rx):      Is a particular medication being requested? dexAMETHasone 0.5 mg tablet    Was an appointment offered? No    Patient or caller has been notified of the 24-48 hour turnaround time.

## 2023-04-13 MED ORDER — DEXAMETHASONE 0.5 MG PO TABS
.5 mg | ORAL_TABLET | Freq: Four times a day (QID) | ORAL | 0 refills | 3.00000 days | Status: AC
Start: 2023-04-13 — End: 2023-04-19

## 2023-04-13 NOTE — Telephone Encounter
New Rx was sent. Patient was called and informed.

## 2023-04-18 ENCOUNTER — Telehealth: Payer: BLUE CROSS/BLUE SHIELD

## 2023-04-18 ENCOUNTER — Ambulatory Visit: Payer: BLUE CROSS/BLUE SHIELD

## 2023-04-18 MED ORDER — DEXAMETHASONE 0.5 MG PO TABS
.5 mg | ORAL_TABLET | Freq: Four times a day (QID) | ORAL | 0 refills | 3.00000 days | Status: AC
Start: 2023-04-18 — End: ?

## 2023-04-18 NOTE — Telephone Encounter
I spoke to patient forgot to take at 2pm Dexamethasone. Please advise if pt should he repeat is afraid he will get false result again. Please advise.

## 2023-04-18 NOTE — Telephone Encounter
PDL Call to Clinic    Reason for Call: Patient started the Dexamethasone this morning at 8 am but forgot to take the second dosage and wants to know if he takes it now and continues or stop and start all over another day.     Appointment Related?  []  Yes  [x]  No     If yes;  Date:  Time:    Call warm transferred to PDL: [x]  Yes  []  No    Call Received by Clinic Representative: Dois Davenport     If call not answered/not accepted, call received by Patient Services Representative:

## 2023-04-19 MED ORDER — DEXAMETHASONE 0.5 MG PO TABS
.5 mg | ORAL_TABLET | Freq: Four times a day (QID) | ORAL | 0 refills | 3.00000 days | Status: AC
Start: 2023-04-19 — End: ?

## 2023-08-30 ENCOUNTER — Inpatient Hospital Stay
Admit: 2023-08-30 | Discharge: 2023-08-31 | Disposition: A | Discharge status: Other Acute Inpatient Hospital | Payer: BLUE CROSS/BLUE SHIELD | Source: Home / Self Care

## 2023-08-30 DIAGNOSIS — R45851 Suicidal ideations: Secondary | ICD-10-CM

## 2023-08-30 NOTE — ED Notes
ER resident at bedside with patient

## 2023-08-30 NOTE — ED Notes
Patient search by security and given green gown. Advised by triage nurse regarding legal status emergency hold. Pt in no acute distress at this time. Denies any AH/HA

## 2023-08-30 NOTE — ED Notes
[facility] NOTIFICATION 08/30/2023 13:38 Michalsky, [place] DOB: 1961-05-14 MRN: [id number]    Criteria Met      CURES    Security and Safety  No Security Events were found.  ED Care Guidelines  There are currently no ED Care Guidelines for this patient. Please check your facility's medical records system.        Prescription Drug Report (12 Mo.)  Rx Details  Fill Date Drug Description Qty. Prescriber   2023-08-21 CLONAZEPAM/0.25 MG/TAB ORALLY DIS [address] DANIEL    2023-07-21 CLONAZEPAM/0.25 MG/TAB ORALLY DIS 66 VELEZ, JUAN C NP VELEZ, JUAN C NP    2023-05-21 LORAZEPAM/1 MG/TAB [address] Charlena Cross    2023-04-15 LORAZEPAM/1 MG/TAB [address] Charlena Cross    2023-03-11 LORAZEPAM/1 MG/TAB [address] Charlena Cross    2023-01-31 LORAZEPAM/1 MG/TAB [address] Charlena Cross    2023-01-01 CLONAZEPAM/0.5 MG/TAB [address] Charlena Cross    2022-12-05 CLONAZEPAM/0.5 MG/TAB [address] Charlena Cross    2022-11-23 LORAZEPAM/0.5 MG/TAB 60 Alwyn Ren MD Alwyn Ren MD    2022-11-09 LORAZEPAM/0.5 MG/TAB 30 Alwyn Ren MD Alwyn Ren MD    2022-10-17 [facility] MG/TAB 30 Noberto Retort, PARVIZ MD Alwyn Ren MD    2022-10-03 LORAZEPAM/1 MG/TAB 30 Noberto Retort, PARVIZ MD Alwyn Ren MD    2022-09-20 LORAZEPAM/0.5 MG/TAB 30 Noberto Retort, PARVIZ MD Alwyn Ren MD      Rx Summary  Metric Count   Quantity Dispensed [phone]   Unique Prescribers 3   Unique Pharmacies 1       E.D. Visit Count (12 mo.)  Facility Visits   [facility] 2   [facility] [place] 1   [facility] [place] 1   Total 4   Note: Visits indicate total known visits.     Recent Emergency Department Visit Summary  Date Facility [facility] Type Diagnoses or Chief Complaint    Aug 30, 2023  [facility] [place]  Emergency     Mar 25, 2023  [facility].  Saddie Benders.  [place]  Emergency  Chief Complaint: BLOOD IN STOOL    Feb 12, 2023  [facility] Nathaneil [place]ary.  [place]  Emergency      Generalized anxiety disorder      Bradycardia      Tachycardia      Palpitations      Racing heart , brusing      Jan 16, 2023  [facility].  Saddie Benders.  [place]  Emergency  Chief Complaint: BEHAVORIAL , BACK PAIN      Recent Inpatient Visit Summary  No Recent Inpatient Visits were found.  Care Team  No Care Team was found.  [facility]  This patient has registered at the [facility] Emergency Department  For more information visit: [email or web address]   PLEASE NOTE:     1.   Any care recommendations and other clinical information are provided as guidelines or for historical purposes only, and providers should exercise their own clinical judgment when providing care.    2.   You may only use this information for purposes of treatment, payment or health care operations activities, and subject to the limitations of applicable [facility] Policies.    3.   You should consult directly with the organization that provided a care guideline or other clinical history with any questions about additional information or accuracy or completeness of information provided.    ?  2025 [facility] - [email or web address]

## 2023-08-31 LAB — Expedited COVID-19 and Influenza A B PCR

## 2023-08-31 MED ADMIN — LORAZEPAM 1 MG PO TABS: 1 mg | ORAL | @ 04:00:00 | Stop: 2023-08-31 | NDC 00904600861

## 2023-08-31 NOTE — Other
[facility] Department of Mental Health - MH 302 NCR                                                 Department of Health Care Services  [place]                                       Health and Hospital doctor   APPLICATION FOR ASSESSMENT, EVALUATION, AND CRISIS INTERVENTION OR PLACEMENT FOR EVALUATION AND TREATMENT  Confidential Client/Patient Information  See [place] W&I Code, Section 5328 and HIPAA Privacy Rule 45 C.F.R. ? 5.508  Welfare and Institutions Code (W&I Code), Section 5150(f) and (g), require that each person, when first detained for psychiatric evaluation, be given certain specific information orally and a record be kept of the advisement by the evaluating facility. DETAINMENT ADVISEMENT  My name is ________________________  I am a (peace officer/mental health professional) with (name of agency).  You are not under criminal arrest, but I am taking you for examination by mental health professionals at (name of facility).     You will be told your rights by the mental health staff.     If taken into custody at his or her residence, the person shall also be told the following information:       [X]  Advisement Complete        [  ] Advisement Incomplete      You may bring a few personal items with you, which I will have to approve. Please inform me if you need assistance turning off any appliance or water. You may make a phone call and leave a note to tell your friends or family where you have been taken.   Good Cause for Incomplete Advisement:                                                                                               Advisement Completed By:  [name]        Position:   Resident physician        Language or Modality Used:  Spoken English        Date of Advisement:  08/30/2023          To (name of 5150 designated facility): [facility]    Application is hereby made for the assessment and evaluation of Tyler Mahoney     residing at [address] [place], [place], [address], [place], for up to 72-hour assessment, evaluation, and crisis intervention or placement for evaluation and treatment at a designated facility pursuant to Section 5150, et seq. (adult) or Section 5585 et seq. (minor), of the W&I Code. If a minor, authorization for voluntary treatment is not available and to the best of my knowledge, the legally responsible party appears to be/is: (Check one):  []  Parent;  []   Legal Guardian;  []  Conservator;  []  Juvenile Court under W&I Code 300;  []  Juvenile Court under W&I Code 601/602.      If known, provide names, address and telephone numbers in area provided below:   N/A          The above person?s condition was called to my attention under the following circumstances:    [facility], consultation to Psychiatry     [ ]  Amended 5150, original scanned in EHR        I have probable cause to believe that the person is, as a result of a mental health disorder, a danger to others or to himself/herself, or gravely disabled because: (state specific facts):     Patient reports aborted suicide attempt, driving to top of parking structure with intent to jump  off after writing suicide note two weeks ago. Was unable to safety plan or contract for safety with outpatient psychiatrist today. Patient's outpatient psychiatrist expressed significant concern for patient's safety if he were to discharge from the hospital. On presentation to ED, patient minimizing attempt.        (CONTINUED ON NEXT PAGE)   Reference: DHCS 1801 (06/18)                                                                                                                     Page 1 of 4  Original Application: Sales executive to Assessment, Evaluation, and Crisis Intervention Location or 5150/5585 Designated Facility   [facility] Department of Mental Health - MH 302 NCR Department of Health Care Services  [place]                                       Health and Health and safety inspector Agency            CLIENT NAME: Jamason Landis   APPLICATION FOR 72 HOUR DETENTION FOR EVALUATION AND TREATMENT (CONTINUED)     Historical course of the person?s mental disorder:  [X]  I have considered the historical course of the person?s mental disorder: [Includes evidence presented by service/support provider, family member(s), and person subject to probable cause determination or designee.]  Patient with MDD and acute worsening of chronic SI. Patient's outpatient psychiatrist does not feel his current medication regimen is adequately controlling his symptoms and does not feel he can be managed in an outpatient setting.    [  ] No reasonable bearing on determination  [  ] No information available because: N/A       History Provided by (Name) Address Phone Number Relation   Mila Homer, MD  [phone] Outpatient Psychiatrist            Based upon the above information, there is probable cause to believe that said person is, as a result of mental health disorder:     [X]  A danger to himself/herself.  [  ]  A danger to others.        [  ] Gravely disabled adult.  [  ] Gravely disabled minor.        Minors only: []  Based upon the above information, it appears that there is probable cause to believe that authorization for voluntary treatment is not available.   Signature, title, and badge number of Tax adviser, professional person in charge of the facility designated by the county for evaluation and treatment, member of the attending staff, designated members of a mobile crisis team, or professional person designated by [employer].     X Eldoris Beiser D. Darnelle Bos Badge/NPI#: [id number]   (Electronically signed)      Date: 08/30/2023       Phone:  [phone]        Time: 6:40 PM         Name of Law Enforcement Agency or Evaluation Facility/Person:    [facility] Address of Sales executive or Evaluation Facility/Person:   [address] [place], [place], [place] [address]   For patients in Medical ER?s, detention began:  Date:  08/30/23  Time:  1340           NOTIFICATIONS TO BE PROVIDED TO LAW ENFORCEMENT AGENCY     Notify (officer/unit & telephone #): N/A        NOTIFICATION OF PERSON?S RELEASE IS REQUESTED BY THE REFERRING PEACE OFFICER BECAUSE:   [  ] The person has been referred to the facility under circumstances which, based upon an allegation of facts regarding actions witnessed by the officer or another person, would support the filing of a criminal complaint.  []  Weapon was Engineer, drilling to Section 8102 W&I Code. Upon release, facility is required to provide notice to the person regarding the procedure to obtain return of any confiscated firearm pursuant to Section 8102 W&I Code.   Reference: [facility] 1801 (06/18)                                                                                                                     Page 2 of 4  Original Application: Accompany Client to Assessment, Evaluation, and Crisis Intervention Location or 5150/5585 Designated Facility   [facility] [employer] of Mental Health - MH 302 NCR                                                 Department of Health Care Services  [place]                                       Health and Health and safety inspector Agency   SEE SUBSEQUENT PAGES FOR DEFINITIONS AND REFERENCES     DEFINITIONS AND REFERENCES   ?Gravely Disabled?  means a condition in which a person, as a result of a mental disorder, is unable to provided for his or her basic personal needs for food, clothing and shelter.  SECTION 5008(h) W&I Code    ?Gravely Disabled Minor? means a minor who, as a result of a mental disorder, is unable to use the elements of life which are essential to health, safety, and development, including food, clothing, and shelter, even though provided to the minor by others.  Intellectual disability, epilepsy, or other developmental disabilities, alcoholism, other drug abuse, or repeated antisocial behavior do not, by themselves, constitute a mental disorder.  SECTION 5585.25 W&I Code    ?Tax adviser? means a duly Music therapist as that term is defined in Chapter 4.5 (commencing with Section 830) of Title 3 of Part 2 of the Penal Code who has completed the basic training course established by the Commission on [employer], or any Civil Service fast streamer or probation officer specified in Section 830.5 of the Penal Code when acting in relation to cases for which he or she has a legally mandated responsibility. SECTION 5008 (i) W&I Code    Section 5152.1 W&I Code: The professional person in charge of the facility providing 72-hour evaluation and treatment, or his or her designee, shall notify the county Secondary school teacher or the director's designee and the Tax adviser who makes the written application pursuant to Section 5150 or a person who is designated by the Patent examiner agency that employs the Tax adviser, when the person has been released after 72-hour detention, when the person is not detained, or when the person is released before the full period of allowable 72-hour detention if all the conditions apply:    (a)  The Tax adviser requests such notification at the time he or she makes the application and the Tax adviser certifies at that time in writing that the person has been referred to the facility under circumstances which, based upon an allegation of facts regarding actions witnessed by the officer or another person, would support the filing of a criminal complaint.   (b) The notice is limited to the person's name, address, date of admission for 72-hour evaluation and treatment, and date of release.        If a Emergency planning/management officer, Sales executive, or designee of the Sales executive, possesses any record of information obtained pursuant to the notification requirements of this section, the officer, agency, or designee shall destroy that record two years after the receipt of notification.     Section 5150.05 W&I Code:    (a)  When determining if probable cause exists to take a person into custody, or cause a person to be taken into custody, pursuant to Section 5150, any person who is authorized to take that person, or cause that person to be taken, into custody pursuant to that section shall consider available relevant information about the historical course of the person's mental disorder if the authorized person determines that the information has a reasonable bearing on the determination as to whether the person is a danger to others, or to himself or herself, or is gravely disabled as a result of the mental disorder.   (b) For purposes of this section, ''information about the historical course of the person's mental disorder'' includes evidence presented by the person who has provided or is providing mental health or related support services to the person subject to a determination described in subdivision (a), evidence presented by  one or more members of the family of that person, and evidence presented by the person subject to a determination described in subdivision (a) or anyone designated by that person.      Reference: [facility] 1801 (06/18)                                                                                                                     Page 3 of 4  Original Application: Accompany Client to Assessment, Evaluation, and Crisis Intervention Location or 5150/5585 Designated Facility   [facility] Department of Mental Health - MH 302 NCR                                                 Department of Health Care Services  [place]                                       Health and Hospital doctor   DEFINITIONS AND REFERENCES (CONTINUED)    (c)  If the probable cause in subdivision (a) is based on the statement of a person other than the one authorized to take the person into custody pursuant to Section 5150, a member of the attending staff, or a professional person, the person making the statement shall be liable in a civil action for intentionally giving any statement that he or she knows to be false.   (d) This section shall not be applied to limit the application of Section 5328.          Section 5152.2 W&I Code: Each Patent examiner agency within a county shall arrange with the county Secondary school teacher a method for giving prompt notification to English as a second language teacher pursuant to Section 5152.1 W&I Code.     Section 5585.50 W&I Code: The facility shall make every effort to notify the minor's parent or legal guardian as soon as possible after the minor is detained. Section 5585.50 W&I Code.    A minor under the jurisdiction of the [employer] under Section 300 W&I Code, is due to abuse, neglect or exploitation.    A minor under the jurisdiction of the Juvenile Court under Section 601 W&I Code is due to being adjudged a ward of the court as a result of being out of parental control.    A minor under the jurisdiction of the Juvenile Court under Section 602 W&I Code is due to being adjudged a ward of the court because of crimes committed.     Section 8102 W&I Code (EXCERPTS FROM):    (a)  Whenever a person who has been detained or apprehended for examination of his or her mental condition or who is a person described in Section 8100 or 8103, is found to own, have in his or  her possession or under his or her control, any firearm whatsoever, or any deadly weapon, the firearm or other deadly weapon shall be confiscated by any IT consultant, who shall retain custody of the firearm or other deadly weapon. ''Deadly weapon,'' as used in this section, has the meaning prescribed by Section 8100.   (b) (1) Upon confiscation of any firearm or other deadly weapon from a person who has been detained or apprehended for examination of his or her mental condition, the Tax adviser or law enforcement agency shall issue a receipt describing the deadly weapon or any firearm and listing any serial number or other identification on the firearm and shall notify the person of the procedure for the return, sale, transfer, or destruction of any firearm or other deadly weapon which has been confiscated. A peace officer or law enforcement agency that provides the receipt and notification described in Section 33800 of the Penal Code satisfies the receipt and notice requirements.  (2) If the person is released, the professional person in charge of the facility, or his or her designee, shall notify the person of the procedure for the return of any firearm or other deadly weapon which may have been confiscated.  (3) Health facility personnel shall notify the confiscating law enforcement agency upon release of the detained person, and shall make a notation to the effect that the facility provided the required notice to the person regarding the procedure to obtain return of any confiscated firearm.        Health and Safety Code [phone] (d)     A person detained under this section in a medical emergency room shall be credited for the time detained, up to twenty-four hours, in the event he or she is placed on a 72-hour hold pursuant to Section 5150 of the Welfare and Institutions Code.     Reference: DHCS 1801 (06/18)                                                                                                                     Page 4 of 4  Original Application: Accompany Client to Assessment, Evaluation, and Crisis Intervention Location or 5150/5585 Designated Facility

## 2023-08-31 NOTE — Other
[place] - Health and Investment banker, corporate of Health Care Services   INVOLUNTARY PATIENT ADVISEMENT  (TO BE READ AND GIVEN TO THE  PATIENT AT TIME OF ADMISSION) Confidential Patient Information  See W&I Code Section 5328 and   HIPAA Privacy Rule 45 C.F.R. Section 164.508   Name of Facility     [facility]   Patient?s Name     Tyler Mahoney Admission Date     08/30/2023   Section 5150(h) of the Welfare and Institutions Code requires that each person admitted to a facility designated by the county for evaluation and treatment be given specific information orally and in writing, and in a language or modality accessible to the person and a record of the advisement be kept in the person?s medical record.   My name is Tyler Mahoney. Tyler Mahoney My position here is Resident physician   You are being placed in this psychiatric facility because it is our professional opinion, that as a result of a mental health disorder, you are likely to: (check applicable)   [X]  Harm yourself [  ] Harm someone else [  ] Be unable to be take care of your own food clothing or shelter   (List specific facts upon which the allegation of dangerous or gravely disabled due to mental health disorder is based, including pertinent facts arising from the admission interview):   We believe this is true because  You reported driving to the top of a parking structure and writing a suicide note two weeks ago. Your outpatient psychiatrist does not feel he can manage to ensure your safety as an outpatient.   You will be held for a period of up to 72 hours. This ( []  does not ) ( [x]  does) include weekends or holidays.   Your 72-hour period begins: 08/30/23 @ 1:40pm    (Time and Date)   Your 72-hour evaluation and treatment period will end at: 09/02/23 @ 1:40pm    (Time and Date)   You will be held for a period up to 72 hours. During the 72 hours you may also be transferred to another facility. You may request to be evaluated or treated at a facility of your choice. You may request to be evaluated or treated by a mental health professional of your choice. We cannot guarantee the facility or mental health professional you choose will be available, but we will honor your choice if we can.     During these 72 hours you will be evaluated by the facility staff, and you may be given treatment, including medications. It is possible for you to be released before the end of the 72 hours. But if the staff decides that you need continued treatment you can be held for a longer period of time. If you are held longer than 72 hours, you have the right to a lawyer and a qualified interpreter and a hearing before a judge. If you are unable to pay for the lawyer, then one will be provided to you free of charge.     If you have questions about your legal rights, you may contact the county Patients? Rights Advocate at [phone] or [phone] (phone number of county Patients? Rights Metallurgist).   Good cause for Incomplete Advisement N/A Date N/A   Advisement Completed by  (Electronically signed by)  Tyler Mahoney. Tyler Mahoney Position  Resident physician Language or Modality Used  Spoken English Date  08/30/2023  CC: Original to the Patient   DHCS 1802 (08/2012) [place] to the Patient?s Record

## 2023-08-31 NOTE — Other
[facility]  PATIENT/GUARDIAN/PARENT CONSENT TO TREATMENT WITH SPECIFIED MEDICATIONS    Due to public health concerns (COVID-19), for the safety of patient, guardians/parents/visitors, and healthcare workers, medications were discussed via  face to face discussion .      Chartered loss adjuster met with patient, conservator, or parent to discuss the patient's condition which requires treatment. Author has recommended treatment of this illness or condition with psychotropic medications. The patient has been provided with the following information by author prescribing such medications:  The nature of the mental illness/condition  The reasons for taking such medications, including the likelihood of improving or not improving without such medication, and that patient's consent, once given, may be withdrawn at any time by patient stating such intention to any member of the treating staff.  The reasonable alternative treatment available, if any.  The type, frequency, amount, method (oral or injection), and duration of medication treatment.  The side effects that this/these particular medication(s) may cause, as well as side effects which may occur because of physical or medical condition(s) that the patient may have or interaction with other medications or foods. If neuroleptics have been prescribed, Thereasa Parkin has discussed with patient the possible complication of tardive dyskinesia, its symptoms and implications.  The possible consequences of abrupt discontinuation of this medication and ways to avoid these consequences.  The possible additional side effects which may occur when taking such medication beyond three months.  The patient has the right to request and be provided whatever additional information they desire.    The patient/parent/guardian provided verbal consent that: (1) they agreed to the foregoing; (2) the medications and treatment set forth above have been adequately explained and/or discussed with with patient/parent/guardian by Thereasa Parkin, and they have received all of the information they desire concerning such medication and treatment; and (3) they authorize and consent to the administration of such medications and treatment.    Patient's/Guardian's/Parents Name: (Print First and Last Name)  Tyler Mahoney Patient's/Guardian/Parent's Signature:  Verbal consent provided Date:  08/30/2023 Time:  8:31 PM   Physician's Name: (Print First and Last Name)  [name] Physician's Signature:  Electronically signed by [name] Date:  08/30/2023 Time:  8:31 PM   *Witness' Name: (Print First and Last Name) Witness' Signature: Date: Time:   * RN signature required for telephonic consent only.

## 2023-08-31 NOTE — Consults
Psychiatric ED Consultation Note 08/30/2023   Patient Name: Tyler Mahoney   Patient MRN: [id number]   Date of Birth: [birth year]   Patient Location: [facility]00/[facility]00     Patient Assessment:  This visit was conducted in-person with the patient.    Requesting Physician:  France Ravens., MD    Identifying Data:  Jasin Gibert is a 63 y.o. male with history of anxiety and depression and PMH of HLD and HTN sent from outpatient psychiatrist for aborted suicide attempt 2 weeks ago after writing suicide note to wife .    Marland Kitchen      Chief Complaint: ''my psychiatrist got worried''    Collateral Contact Information:    Extended Emergency Contact Information  Primary Emergency Contact: Baldinger,diane  Mobile Phone: [phone]  Relation: Spouse  Interpreter needed? No    Outpatient Psychiatrist:  Dr. Nils Flack [phone] - text number and leave call back)  Outpatient Therapist:  through [facility]    History of Present Illness:    Patient states that 2 weeks ago, he became distraught when his wife brought up the idea of separation leading him to write a suicide note to her and then drive to the top of a parking structure. He states that he did not stop at the top of the structure and drove back home immediately. He states that he did this in order to get attention from his wife. He states that he had one similar incident several weeks ago where he considered driving to a cliff. He denies any continued SI and expresses a strong desire to not be admitted. Patient states that if he were to have thoughts of suicide, he would prevent himself from acting on them by reminding himself that he does not want to be in the hospital. He states that he will reach out to his wife or his brother for support if having these thoughts. He states that he is motivated to stay alive for his grandchildren. He also states that he and his wife are not longer talking about separation.    He endorses depressed mood but states that he is sleeping well and is able to concentrate. He states that he gets joy from spending time with his grandchildren and denies feelings of hopelessness. He does describe feelings of increased anxiety and restlessness over the last two weeks after starting fluvoxamine. He also states that he has had increased appetite in recent months which he attributes to his medication regimen. He feels as though no medications have ever been helpful for him.        Collateral:    Dr. Nils Flack (outpatient psychiatrist): Spoke over phone. He states that he has been treating the patient since April 2024. He notes that since this time, patient has been struggling with depression, anxiety, and SI but he has become increasingly concerned about him because he feels his medication regimen is not helping, and may be hurting him. He notes that the patient was discharged from residential about a month ago on a complex medication regimen that he has been having difficulty managing on an outpatient basis. He states that the patient in the past has been forthcoming about his SI, but did not share event from two weeks ago until his appointment today and only at encouragement from his wife. He states that when he spoke with the patient earlier today, he expressed feelings of hopelessness, was not future oriented, and was unable to contract for safety. He has concern that patient is  minimizing his symptoms with notewriter to avoid hospitalization. He states that his strong preference would be for patient to be hospitalized as he is worried that patient may act on his suicidal thoughts since this most recent attempt was an escalation from prior attempts given his preparation by writing a suicide note.    Diane Hitchins (wife): Spoke over phone. She states that she has been feeling increasingly frustrated with the patient's mental health. She states that he has had multiple instances where he threatened suicide. Contrary to patient's report, she states that he did drive to cliffs in [place] due to thoughts of jumping off. Wife feels that this is attention-seeking. She states that she does not feel he is ''close to suicide'' but does feel that this may show him the ''consequences of his actions'' with regard to threatening suicide. She does feel strongly that his medications need to be adjusted, as he is not doing well on his current regimen, and she feels that adjusting medications would be a benefit of hospitalization.    Psychiatric History:  - Psychiatric diagnoses:  anxiety and depression  - Psychiatric hospitalizations:  one at [facility] in Oct 2023  - Suicide attempts:  multiple other self-aborted suicide attempts  - Self-injurious behavior: No history of self-injurious behavior.  - Violent behavior: No history of violent behavior.  - Engagement in outpatient psychiatric care:  sees outpatient psychiatrist, Dr. Isaiah Blakes and also currently at [facility]  - Psychiatric medication trials:  abilify, mirtazapine, wellbutrin, fluvoxamine, lexapro, ativan, klonopin, seroquel, trazodone, and gabapentin      Substance Abuse History:  Denies    Alcohol Screen:  No or Low Risk: The patient was screened with a validated tool, and the score on the alcohol screen indicates no or low risk of alcohol related problems.    Tobacco Screen:  Never tobacco user.    Social History:  Retired school principal. Is married and has two adult sons. Has two grandchildren who live in the [place] area who he sees ~twice/month. Also has a twin brother. Enjoys surfing and running    Family Psychiatric History:  Per chart review: Mother with possible Bipolar Disorder, EtOH use disorder (multiple hospitalizations in patient's youth leading to father getting custody)   Brother OCD, EtOH use disorder    Developmental and Educational History:  Not assessed due to patient's adult age and current clinical presentation.    Assets and Strengths:   Support of family/friends/partner (wife)  Recreational interests (reading, running)    Disabilities and Liabilities:  Poor understanding of illness (limited insight)  Relationship conflict (wife recently brought up separation)    Past Medical History:  HTN  HLD    Allergies and Adverse Drug Reactions:  No Known Allergies    Medication Reconciliation:  Wellbutrin 300 mg every day   Fluvoxamine 50 mg every day   Mirtazapine 7.5 mg at bedtime  Abilify 10 mg every day  Klonopin 0.25 mg BID  Losartan 100 mg every day   Hydrochlorothiazide 12.5 mg every day     Atorvastatin 40 mg every day (not taking)    Review of Systems:  Constitutional: No fevers, chills, night sweats, weight changes, malaise, or fatigue.   HEENT: No visual or auditory changes. No headache. No upper respiratory symptoms including cough, sore throat, runny nose, or sinus congestion.   Neck: No stiff neck, neck swelling.   Cardiovascular: No chest pain, palpitations, or shortness of breath.   Pulmonary: No orthopnea or paroxysmal nocturnal  dyspnea.   Gastrointestinal: No nausea, vomiting, diarrhea, or constipation. No hematemesis, hematochezia, or melena.   Genitourinary: No dysuria, hematuria, frequency, or urgency.   Integumentary: No edema. No rash.   Neurologic: No focal weakness, numbness, paresthesia, or gait abnormality.    Physical Examination:  Vitals:    08/30/23 1358 08/30/23 1400 08/30/23 1624   BP: 116/75  122/82   Pulse: 54  60   Resp: 16  18   Temp: 36.9 ?C (98.4 ?F)  36.9 ?C (98.5 ?F)   TempSrc: Oral  Oral   SpO2: 99%  98%   Weight:  59.9 kg (132 lb)    Height:  1.626 m (5' 4'')         General: Appears to be in no acute distress.  HEENT: NC/AT, MMM, oropharynx clear.  CV: RRR, normal S1/S2, no M/R/G.  Pulm: CTAB, no wheezes/rhonchi/rales.  Abd: Soft, NT/ND, normal bowel sounds, no rebound or guarding.  Ext: No C/C/E, 2+ posterior tibial artery pulses bilaterally.  Neuro: 5/5 motor strength and sensation to light touch intact throughout bilateral upper and lower extremities, normal gait.    Cranial Nerve Examination:  CN II, III, IV, VI: PERRL, EOMI, no nystagmus, no ptosis on penlight exam.  CN V: Facial sensation intact to light touch, muscles of mastication intact on direct observation.  CN VII: Face symmetric with good forehead wrinkle and smile excursion bilaterally, no facial droop on direct observation.  CN VIII: Hearing to finger rub is intact bilaterally.  CN IX, X: Uvula is midline, palate raises symmetrically, no hypophonia on pharyngeal exam.   CN XI: Shoulder shrug intact and equal bilaterally on shoulder shrug test.   CN XII: Tongue midline without fasciculation on tongue protrusion test.    Laboratory Data and Studies:  Recent Results (from the past 72 hour(s))   ED INFORMATION EXCHANGE Ordered by an unspecified provider    Collection Time: 08/30/23  1:38 PM   Result Value Ref Range    Emer. Dept. Info Exchange - Care Plan      Emer. Dept. Engineer, mining. Dept. Info Exchange - 30 day Visit Count 1     Emer. Dept. Info Exchange - 180 day Visit Count 2        Mental Status Examination:   Appearance: 63 y.o. male. In no acute distress, well groomed.  Behavior: Cooperative.   Motor:  mildly increased psychomotor activity . Positive for tremor. No evidence of extrapyramidal signs. Gait was grossly normal.  Speech: Normoverbal. Non-pressured.   Mood: ''anxious''  Affect: Affect was mood-congruent and blunted.  Thought process: Linear and logical.  Thought content: Notable for + passive suicidal ideation without intent or plan, no homicidal ideation, no delusions, and no hallucinations.   Cognition: Oriented to person, place, time, and situation. Intellectual functioning is average as evidenced by fund of knowledge. Memory is grossly intact as evidenced by ability to engage in an interview.   Insight: Partial insight as evidenced by ability to verbalize an understanding of the symptoms of his illness, its implications, and the need for treatment.   Judgment: Good judgment as evidenced by help-seeking behavior.     Suicide Risk Assessment:  Risk and protective factors:  1. Suicidal ideation:     Passive SI, a wish to be dead or not alive   2. Intent to act upon thoughts of suicide:     Denies intent   3. Firearms:  No access to a firearm   4. Access to other stated means and environmental factors:     Not applicable   5. Recent suicidal behaviors or preparatory acts:     None   6. Prior suicide attempts:     None   7. Self-directed violence without suicidal intent:     None   8. Psychiatric and family history:    See psychiatric history for details on diagnoses and hospitalizations   9. Other key symptoms and dynamic risk factors:     Severe symptoms of: Anxiety or panic  Issues with mental health treatment: Inadequate care   10. Protective factors:    (Absences of risk factors are also considered proctective factors)  Support from family, friends, or others, specifically wife  Engagement in mental health care  Help seeking behaviors  Identified reasons for living     Risk formulation and interventions:  11. Baseline chronic risk   Compared to the general population, the patient's baseline chronic suicide risk is estimated to be:    Mildly elevated baseline risk   12. Acute risk  The patient's current, acute risk is estimated to be:    Not clearly elevated above his baseline   13. Risk mitigation and interventions:    Patient engaged in his safety and treatment planning  Appropriate treatment has been initiated or ongoing   14. Additional considerations:    None   15. The most appropriate and least restrictive treatment recommendations at this time are:    Outpatient care, including: Psychiatric care, Psychotherapy, An environment with support including family, friend(s), or caregiver(s), Return precautions for psychiatric urgent care or emergency care     Diagnostic Impression:  Mental Health Diagnoses and Relevant Medical Conditions:  Unspecified Mood Disorder  GAD    Significant Psychosocial and Contextual Factors:  Wife recently brought up separation  Outpatient psychiatrist feels unable to manage him    Assessment and Plan:  Qadir Kapple is a 63 y.o. male with history of anxiety and depression and PMH of HLD and HTN sent from outpatient psychiatrist for aborted suicide attempt 2 weeks ago after writing suicide note to wife .    While patient does have chronic SI and does not endorse current SI, his suicidal behaviors have been escalating in recent months from making suicidal statements to driving to location to jump to writing a suicide note prior to self-aborted attempt. While patient appears future oriented at this time and contracting for safety, his outpatient psychiatrist reports that several hours ago he presented very differently and expressed high levels of hopelessness and was not engaging in safety planning or willing to contract for safety. Given this significant difference in presentation, there is concern that patient may be minimizing severity of his suicidality due to his stated strong desire to not be in the hospital. Patient also appears to be responding poorly to current complex medication regimen, which is likely contributing to his current presentation. Given the high level of concern for the patient's safety from his outpatient psychiatrist who saw him earlier today and his expressed concerns of his ability to care for the patient on an outpatient basis at this time, there is significant concern that patient would be a danger to himself if discharged. Patient does meet criteria for involuntary hospitalization at this time and will be placed on a 5150.    Diagnostically, patient's subjective report does not meet criteria for MDD. He endorses depressed mood but denies sleeping difficulties, decreased energy,  anhedonia, feelings of hopelessness, and poor concentration. He does endorse feelings of restlessness and increased appetite, although he relates these to his medication regimen, which may be the case given that he is on activating medications (abilify and wellbutrin) and appetite-stimulating medications (mirtazapine). However, as noted above, there is concern that patient may be minimizing symptoms to avoid hospitalization. Given history, would still strongly consider MDD diagnosis. Notably, patient describes himself as anxious and does appear anxious on exam. He notes that this worsened a couple of weeks ago after starting fluvoxamine. As noted, patient is also on several other activating medications that may be contributing to patient's reported anxiety. While medications may be playing a role, patient was noted to have significant anxiety contributing to hospitalization in Oct 2023 prior to starting medications, so would strongly consider GAD diagnosis as well.    1. Psychiatric:  - Plan to admit for inpatient psychiatric hospitalization    #SI  #Anxiety  #Unspecified Mood Disorder  - D/c fluvoxamine given worsening anxiety after starting  - continue abilify 10 mg every day    - consider d/c'ing given reported akathisia  - continue wellbutrin 300 mg every day    - consider decreasing or d/c'ing given patient's tremor and restlessness  - continue mirtazapine 7.5 mg at bedtime  - continue klonopin 0.5 mg BID    PRN:  Hydroxyzine 50 mg q6h first-line for anxiety  Seroquel 25 mg QID second-line for anxiety  Ativan 1 mg QID third-line for anxiety  Trazodone 50-100 mg for insomnia  Ativan 2 mg q6h for agitation  Haldol 5 mg/Ativan 2 mg/Benadryl 50 mg IM once for emergent agitation    Goals for Hospitalization:  Mcarthur Bernasconi will have improved insight as evidenced by adequately safety planning with treatment team by his anticipated discharge date 1 week from now (on 09/06/2023). (08/30/2023, 8:59 PM: added as goal by Johnnette Barrios D. Darnelle Bos)    2. Medical:    #HTN  - continue home losartan 100 mg every day   - continue home hydrochlorothiazide 12.5 mg every day #FEN/GI/PPx:  -Nutrition: regular diet.    3. Legal Status: Involuntary  -5150 (72-hour) hold for Danger to Self to expire 09/02/23 @ 1340    4. Code Status: Full code.    5. Disposition: Admit to [facility].        Note written by [name], psychiatry resident. This patient was discussed with Dr. Durenda Guthrie, attending psychiatrist, with whom the above assessment and plan were jointly formulated. Recommendations and plan were discussed with referring treatment team.    The case was discussed with Dr Darnelle Bos and I agree with the diagnosis and treatment plan.

## 2023-08-31 NOTE — ED Notes
Psych MD at bedside with patient

## 2023-09-07 NOTE — ED Provider Notes
Tyler Mahoney [facility]  Emergency Department Service Report    Triage     Tyr Mahoney, a 63 y.o. male, presents with Suicidal (Thoughts of SI without a plan ongoing for months. Sent by his psychiatrist. Denies HI, AH/VH)    Arrived on 08/30/2023 at 1:38 PM   Arrived by Walk-in [14]    ED Triage Vitals   Temp Temp Source BP Heart Rate Resp SpO2 O2 Device Pain Score Weight   08/30/23 1358 08/30/23 1358 08/30/23 1358 08/30/23 1358 08/30/23 1358 08/30/23 1358 08/30/23 1624 08/30/23 1400 08/30/23 1400   36.9 ?C (98.4 ?F) Oral 116/75 54 16 99 % None (Room air) Zero 59.9 kg (132 lb)       No Known Allergies     Initial Physician Contact       Comprehensive Exam Initiated  Contact Date: 08/30/23  Contact Time: 1542    History   HPI    Burce Mahoney is a 63 y.o. male PMH deprsession, anxiety sent in by outpatient psychiatry for SI. He states ''I think my psychiatrist is calling my bluff on wanting to harm myself.'' Reports acute on chronic worsening symptoms. He states he told his psychiatrist thinking about driving off the top of a parking garage in his car. Currently taking his medication - no recent dose changes. Hallucinations:  none . + prior hospitalizations. No medical complaints or concerns. Able to perform ADL. No HI.     Modifying factors  - meds taken: as above  - worsens symptoms: stressors  - improves symptoms: nothing    Denies: HA, vision changes, cough, nausea, emesis, chest pain, sob, abdominal pain, dysuria, BRBPR, melena  Denies: alcohol/drug use  Denies: recent travel/sick contacts    Family history: reviewed, no pertinent   Medical history: reviewed, as above  Social history: reviewed, no pertinent  Surgical history: reviewed, no pertinent      Language Assistance                     Past Medical History:   Diagnosis Date    Anxiety 05/04/2022    Depression 05/04/2022    Hyperlipidemia 03/20/2012    Thats an estimate    Hypertension 03/20/2012    Estimate    Memory problem 05/04/2032        Past Surgical History:   Procedure Laterality Date    ELECTROCONVULSIVE THERAPY,1 SEIZ  09/04/2023    VASECTOMY  08/26/96    Estimate        Past Family History   family history includes Depression in his brother and mother; Hypertension in his father.     Past Social History   he reports that he has never smoked. He has never used smokeless tobacco. He reports that he does not currently use alcohol after a past usage of about 6.0 oz of alcohol per week. He reports current drug use. Frequency: 1.00 time per week. Drug: Marijuana. He reports being sexually active and has had partner(s) who are male. He reports using the following method of birth control/protection: Vasectomy.       Physical Exam   Physical Exam  Constitutional:       General: No acute distress.  HENT:      Head: Normocephalic and atraumatic.      Nose: No congestion or rhinorrhea.      Mouth/Throat:      Mouth: Mucous membranes are moist.      Pharynx: Oropharynx is clear.   Eyes:  Extraocular Movements: Extraocular movements intact.      Pupils: Pupils are equal, round, and reactive to light.   Cardiovascular:      Rate and Rhythm: Normal rate and regular rhythm.      Heart sounds: No murmur heard.  Pulmonary:      Effort: Pulmonary effort is normal.      Breath sounds: Normal breath sounds.   Abdominal:      General: There is no distension.      Palpations: Abdomen is soft. No CVA tenderness.     Comments:   Musculoskeletal:         General: Normal range of motion.      Cervical back: Normal range of motion and neck supple.      Right lower leg: No edema.      Left lower leg: No edema.   Skin:     General: Skin is warm and dry.   Neurological:      General: No focal deficit present. Alert and oriented, moving all 4 extremities. CN not formally tested but appear grossly intact. Observed to ambulate with normal gait.     Mental Status: Patient is alert.      Medical Decision Making   Tyler Mahoney is a 63 y.o. male Given Pt's sx, I have strong concern that Pt is a danger to self  due to a primary psychiatric disorder.  Will place the Pt on a temporary 8h medical hold and will consult psychiatry.  At this time, Pt shows no toxidromes concerning for acute ingestion or any other significant medical needs.    Plan  - 8h hold until psychiatric evaluation  - 1 to 1 sitter  - Psychiatry consult        Medical Decision Making     Launch MDCalc MDM Tool   MDCalc MDM Module  Sep 07 2023 7:11 AM [facility] Roces]  Data:  - Discussed with external professional: Case discussed with provider from Psychiatry. See MDM section and/or ED Course for additional details on the discussion.  - Test/documents/historian: 3 tests ordered  Problems: Suicidal ideation  Risk: LORazepam tablet (Rx drug management)                    Problems  Clinical Impressions Complexity of problems addressed         Suicidal ideation (Primary) High:  [x]  Acute/chronic illness/injury with threat to life to bodily function  []  Chronic illness with severe exacerbation, progression, or side effects of treatment    Moderate:  []  Undiagnosed new problem with uncertain prognosis  []  Acute illness with systemic symptoms  []  Acute complicated injury    Data and Risk  Independent Historian []  Parent as child too young to provide hx []  Family/Caregiver due to AMS/dementia []  EMS due to medical acuity/trauma []  Family/EMS due to behavioral health concern and for collateral []    External Data Reviewed previous workup and mgmt of patient's Suicidal via  []  Previous [place] Notes/Labs/Imaging []  External Notes/Labs/Imaging  which were non-contributory unless documented otherwise in HPI and ED Course   Considered but decided against  []  CT Head/C-spine given [place] CT/NEXUS/PECARN criteria []  CTA Chest to r/o PE given PERC/Well's criteria []  CT AP to r/o appendicitis given PAS score or family discussion []  Hospitalization due to *  []    Discussed w/ ext HCP [x]  Consults, PCP/outpt specialists, nursing home. See ED Course for details. SDOH Affecting Dx/Tx []  Insurance limiting specialist referral []  Housing instability limiting  outpt mgmt   []  Financial insecurity limiting medication access []  Substance/ETOH use []    Care de-escalation []  Shared decision-making regarding de-escalation of care (e.x. DNR) or foregoing hospitalization-level of care due to patient wishes/goals of care   Interpretations See ED Course. [x]  Independent interpretations of ECG or Radiology: See ED Course   If applicable, parenteral controlled substances, drug therapies requiring intensive monitoring for toxicity, and prescription drug management are documented in the the Medications section of this note. If applicable, major and minor procedures are documented separately in Procedure Notes.           Progress Notes / Reassessments                 ED Course      Laboratory Results     Labs Reviewed   EXPEDITED COVID-19 AND INFLUENZA A B PCR, RESPIRATORY UPPER       Imaging Results     No orders to display       Consults     Consult Orders Placed This Encounter       Ordered     Status .    08/30/23 1602  Consult/Autopage to Psychiatry [Adult & Pediatrics]  Once        Provider:  (Not yet assigned)    Completed             Clinical Impressions           Suicidal ideation (Primary)       Disposition and Follow-up     Disposition: Admit to Psych [14]    No future appointments.    Follow up with:  No follow-up provider specified.    Return precautions are specified on After Visit Summary.    Discharge Medication List as of 08/30/2023  8:49 PM          Medications Administered This Encounter           Status .     LORazepam tab 1 mg  Once         Last MAR action: Given                            Resident Signature          Roces, Artist Pais., MD  Resident  09/07/23 [phone]    I was present with the resident during the key/critical portions of this service.  I have discussed the management with the resident, have reviewed the resident note and agree with the documented findings and plan of care.    Quinlee Sciarra B. Vitor Overbaugh  09/07/2023  11:18 AM         France Ravens., MD  09/07/23 1118

## 2023-09-14 IMAGING — MR RM JOELHO DIREITO
4 of 7 series · 18 of 40 positions shown · non-contrast
Comparison: none

[Series 4: T2 · sagittal · 3.5mm · 0.35mm/px · 3 of 25 slices shown]
[im 5/25]
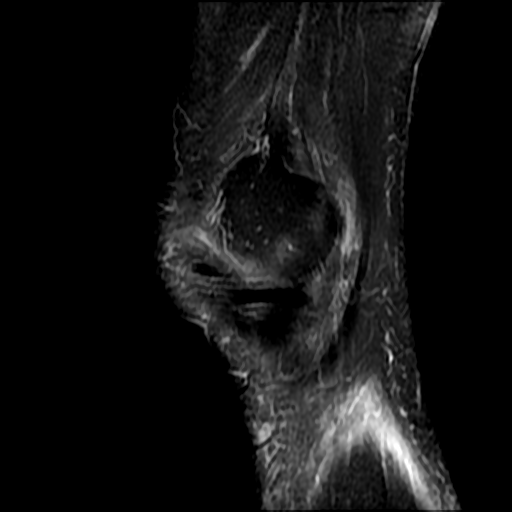
[im 13/25]
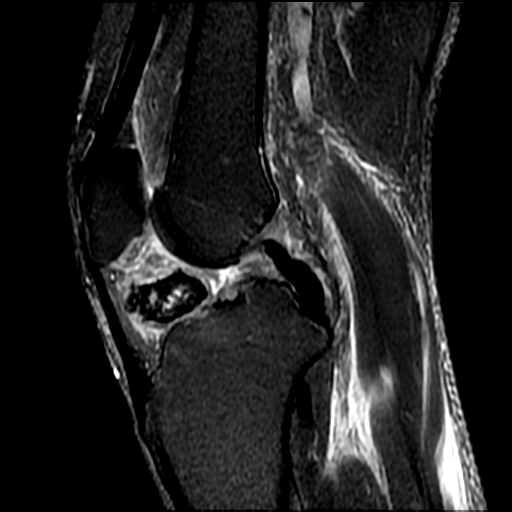
[im 21/25]
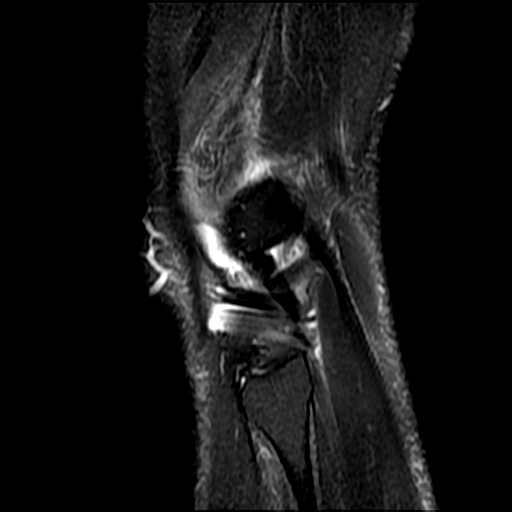

[Series 5: T1 · sagittal · 3.5mm · 0.35mm/px · 6 of 25 slices shown (1 of 2)]
[im 1/25]
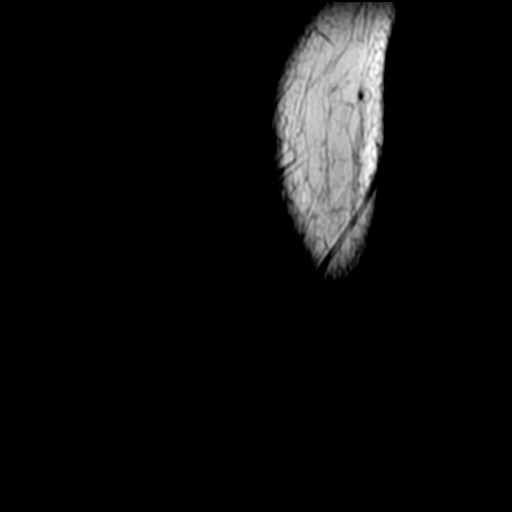
[im 5/25]
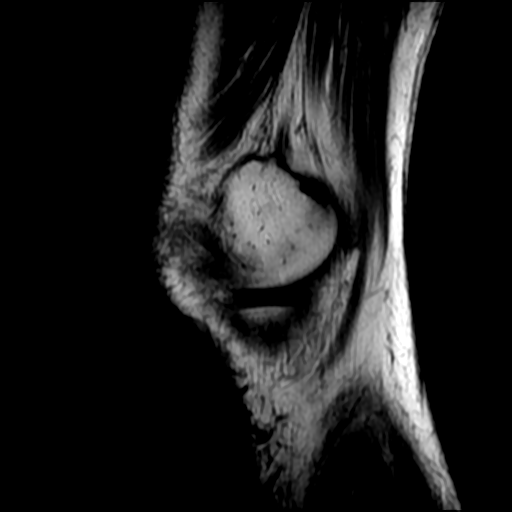
[im 10/25]
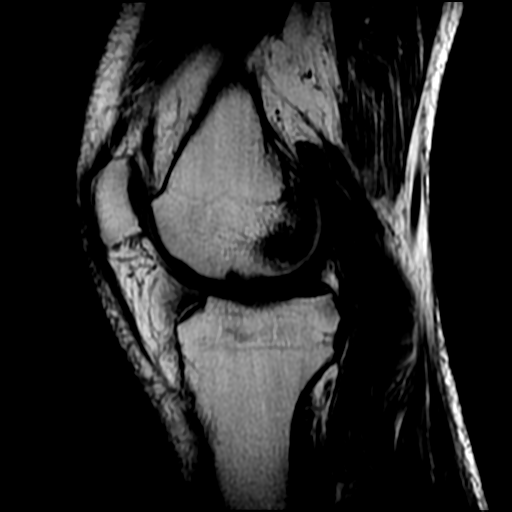
[im 15/25]
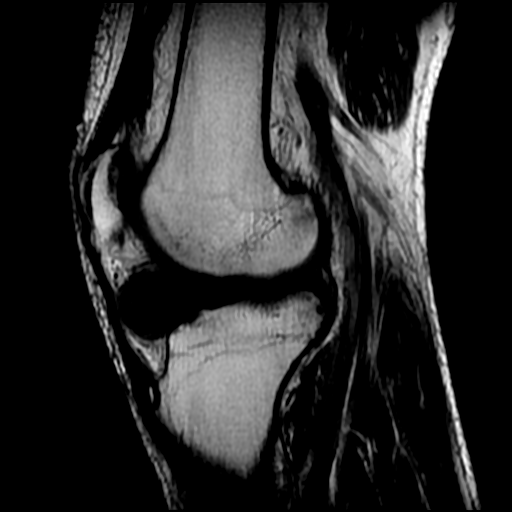
[im 20/25]
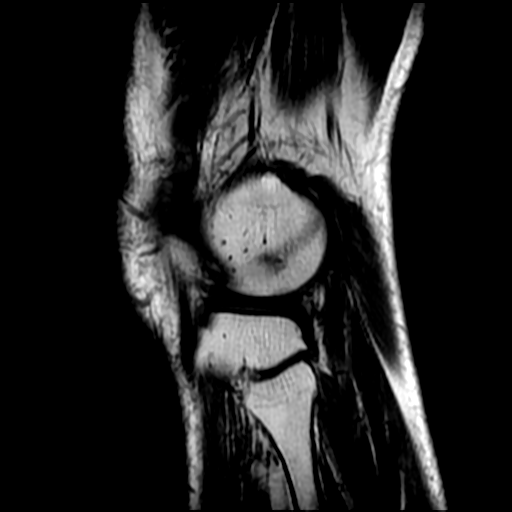
[im 25/25]
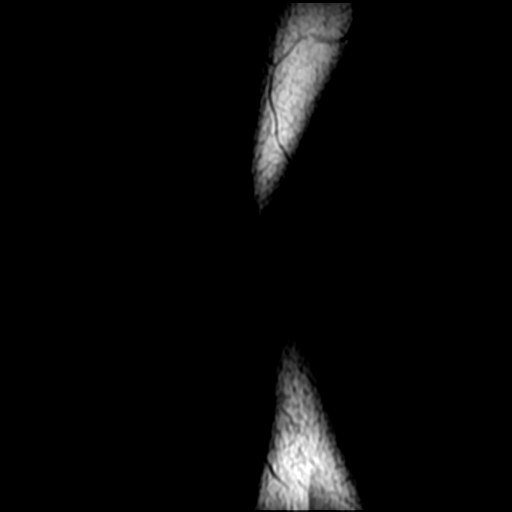

[Series 6: PD · axial · 3.5mm · 0.33mm/px · z∈[-64,+62]mm · 6 of 25 slices shown]
[im 1/25]
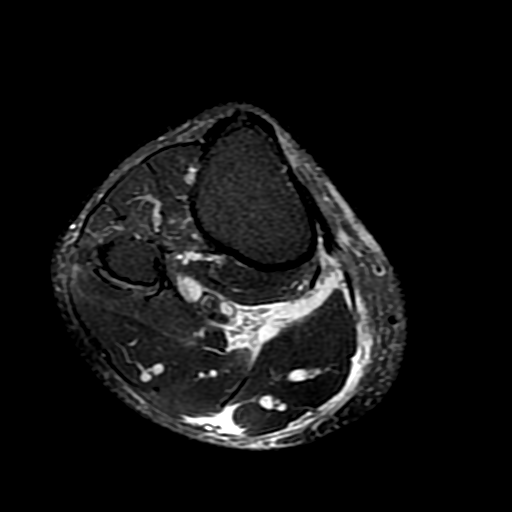
[im 5/25]
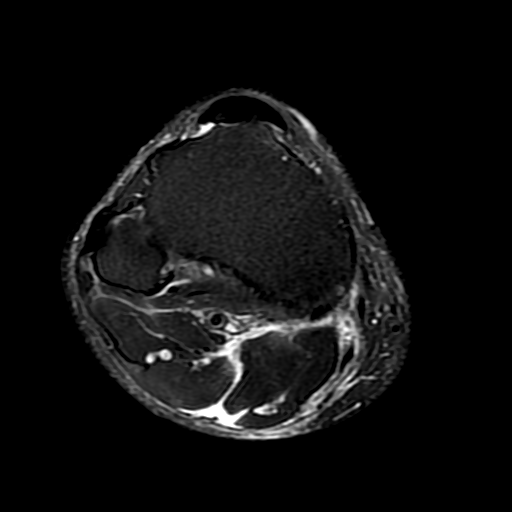
[im 10/25]
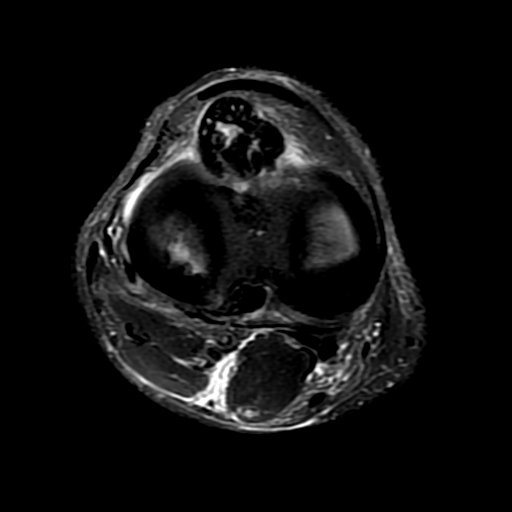
[im 15/25]
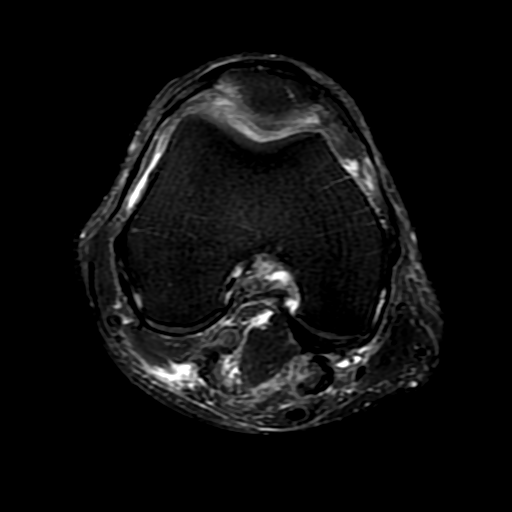
[im 20/25]
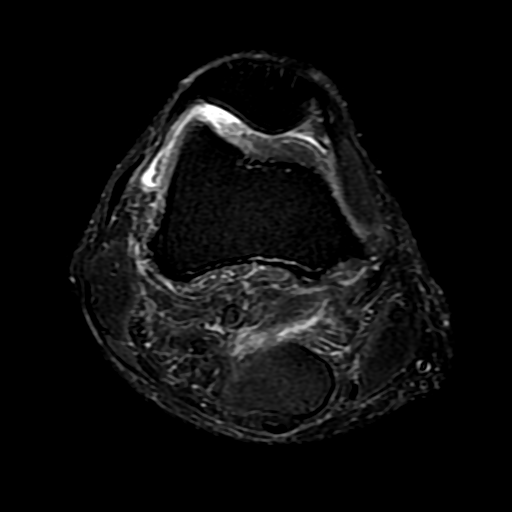
[im 25/25]
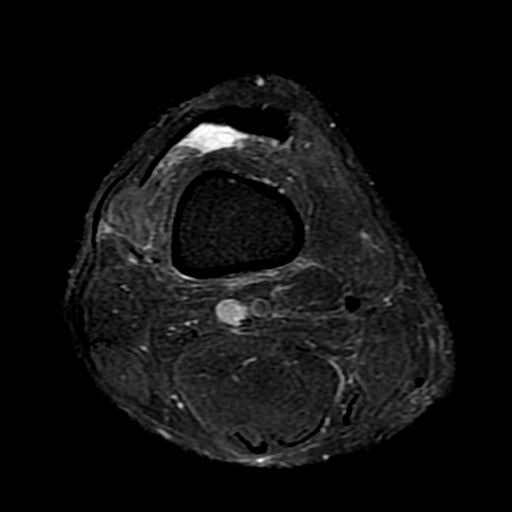

[Series 8: T1 · coronal · 3.5mm · 0.35mm/px · 3 of 25 slices shown (2 of 2)]
[im 5/25]
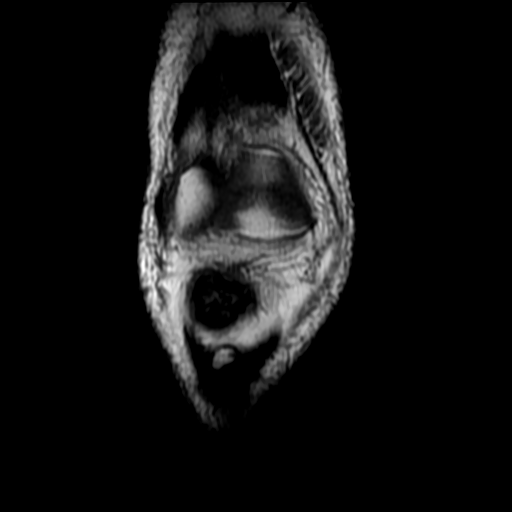
[im 15/25]
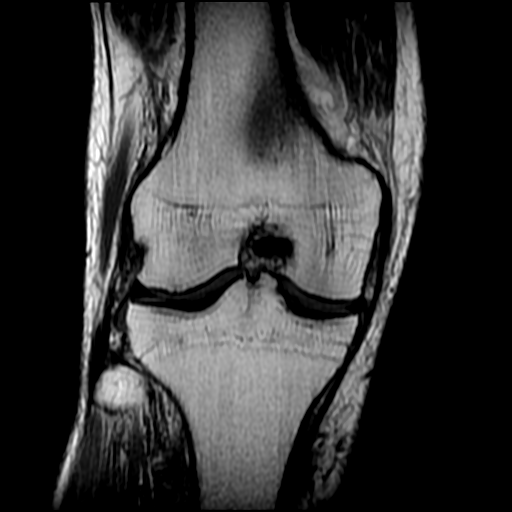
[im 25/25]
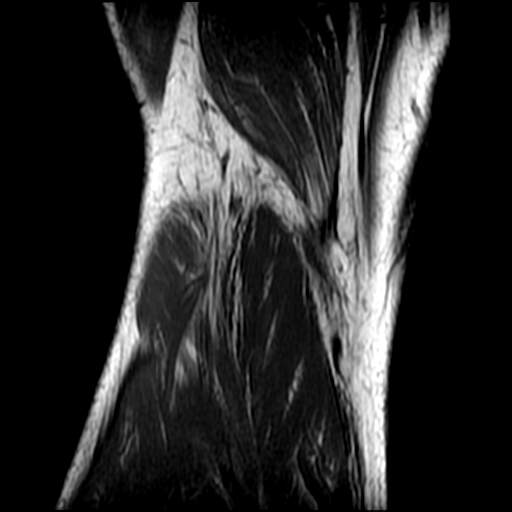

[18 of 40 positions shown; findings below may reference images not displayed]

RESSONÂNCIA MAGNÉTICA DO JOELHO DIREITO

TÉCNICA:

Exame realizado em equipamento de ressonância magnética com sequências, ponderações e planos específicos para o segmento de interesse, sem a administração endovenosa do meio de contraste.

RESULTADO:

Rotura oblíqua do corno posterior do menisco medial, que se comunica com a superfície articular inferior, associada a diminuta rotura radial da margem livre do corpo.
Rotura completa da origem femoral do ligamento cruzado anterior, com translação anterior da tíbia em relação ao fêmur.
Menisco lateral com forma, contornos e sinal preservados, sem sinais de lesão.
Ligamentos cruzado posterior e colaterais com continuidade, espessura e sinal conservados.
Corpo livre no recesso articular anterior medindo cerca de 3,2 cm.
Mínimo derrame articular.
Ossificação no trajeto distal do tendão patelar medindo cerca de 8,2 mm, compatível com sequela de Osgood-Schlatter.
Condropatia patelar caracterizada por fissuras condrais superficiais no vértice e início da facetas, sem alteração de sinal do osso subcondral.
Alterações degenerativas do compartimento femorotibial lateral caracterizadas por afilamento condral do côndilo femoral e planalto tibial em área de carga com pequenos cistos subcorticais e edema ósseo, além de osteófitos marginais.
Demais superfícies condrais regulares, sem fissuras ou erosões evidentes.
Tendões quadricipital, bíceps femoral distal, poplíteo, trato iliotibial e tendões da pata de ganso sem particularidades.
CONCLUSÃO:

Rotura oblíqua do corno posterior do menisco medial, associada a diminuta rotura radial da margem livre do corpo.
Rotura completa da origem femoral do ligamento cruzado anterior.
Corpo livre no recesso articular.
Mínimo derrame articular.
Ossificação no trajeto distal do tendão patelar, compatível com sequela de Osgood-Schlatter.
Condropatia patelar.
Alterações degenerativas do compartimento femorotibial lateral.
